# Patient Record
Sex: Female | Born: 1950 | Race: White | Hispanic: No | Smoking: Never smoker
Health system: Southern US, Community
[De-identification: ages and names within clinical notes are randomized; demographics above are authoritative.]

## PROBLEM LIST (undated history)

## (undated) DIAGNOSIS — H409 Unspecified glaucoma: Secondary | ICD-10-CM

## (undated) DIAGNOSIS — M359 Systemic involvement of connective tissue, unspecified: Secondary | ICD-10-CM

## (undated) DIAGNOSIS — G43909 Migraine, unspecified, not intractable, without status migrainosus: Secondary | ICD-10-CM

## (undated) DIAGNOSIS — K219 Gastro-esophageal reflux disease without esophagitis: Secondary | ICD-10-CM

## (undated) DIAGNOSIS — M1990 Unspecified osteoarthritis, unspecified site: Secondary | ICD-10-CM

## (undated) HISTORY — PX: CARPAL TUNNEL RELEASE: SHX101

## (undated) HISTORY — DX: Unspecified glaucoma: H40.9

## (undated) HISTORY — PX: JOINT REPLACEMENT: SHX530

## (undated) HISTORY — DX: Migraine, unspecified, not intractable, without status migrainosus: G43.909

## (undated) HISTORY — PX: CATARACT EXTRACTION: SUR2

## (undated) HISTORY — PX: BACK SURGERY: SHX140

## (undated) HISTORY — DX: Gastro-esophageal reflux disease without esophagitis: K21.9

## (undated) HISTORY — DX: Systemic involvement of connective tissue, unspecified: M35.9

## (undated) HISTORY — PX: CERVICAL SPINE SURGERY: SHX589

## (undated) HISTORY — DX: Unspecified osteoarthritis, unspecified site: M19.90

---

## 2016-04-30 DIAGNOSIS — M545 Low back pain: Secondary | ICD-10-CM | POA: Diagnosis not present

## 2016-05-01 DIAGNOSIS — M545 Low back pain: Secondary | ICD-10-CM | POA: Diagnosis not present

## 2016-05-04 DIAGNOSIS — M545 Low back pain: Secondary | ICD-10-CM | POA: Diagnosis not present

## 2016-05-08 DIAGNOSIS — M545 Low back pain: Secondary | ICD-10-CM | POA: Diagnosis not present

## 2016-05-12 DIAGNOSIS — M545 Low back pain: Secondary | ICD-10-CM | POA: Diagnosis not present

## 2016-05-20 DIAGNOSIS — M545 Low back pain: Secondary | ICD-10-CM | POA: Diagnosis not present

## 2016-05-22 DIAGNOSIS — M545 Low back pain: Secondary | ICD-10-CM | POA: Diagnosis not present

## 2016-05-25 DIAGNOSIS — M545 Low back pain: Secondary | ICD-10-CM | POA: Diagnosis not present

## 2016-05-26 DIAGNOSIS — M25552 Pain in left hip: Secondary | ICD-10-CM | POA: Diagnosis not present

## 2016-05-26 DIAGNOSIS — M533 Sacrococcygeal disorders, not elsewhere classified: Secondary | ICD-10-CM | POA: Diagnosis not present

## 2016-06-30 DIAGNOSIS — H401131 Primary open-angle glaucoma, bilateral, mild stage: Secondary | ICD-10-CM | POA: Diagnosis not present

## 2016-10-20 DIAGNOSIS — Z9189 Other specified personal risk factors, not elsewhere classified: Secondary | ICD-10-CM | POA: Diagnosis not present

## 2016-10-20 DIAGNOSIS — Z1212 Encounter for screening for malignant neoplasm of rectum: Secondary | ICD-10-CM | POA: Diagnosis not present

## 2016-10-20 DIAGNOSIS — Z87410 Personal history of cervical dysplasia: Secondary | ICD-10-CM | POA: Diagnosis not present

## 2016-10-20 DIAGNOSIS — Z9289 Personal history of other medical treatment: Secondary | ICD-10-CM | POA: Diagnosis not present

## 2016-10-20 DIAGNOSIS — N952 Postmenopausal atrophic vaginitis: Secondary | ICD-10-CM | POA: Diagnosis not present

## 2016-10-20 DIAGNOSIS — Z1231 Encounter for screening mammogram for malignant neoplasm of breast: Secondary | ICD-10-CM | POA: Diagnosis not present

## 2017-01-04 DIAGNOSIS — H401131 Primary open-angle glaucoma, bilateral, mild stage: Secondary | ICD-10-CM | POA: Diagnosis not present

## 2017-07-03 DIAGNOSIS — R6889 Other general symptoms and signs: Secondary | ICD-10-CM | POA: Diagnosis not present

## 2017-07-03 DIAGNOSIS — J0110 Acute frontal sinusitis, unspecified: Secondary | ICD-10-CM | POA: Diagnosis not present

## 2017-07-03 DIAGNOSIS — J9801 Acute bronchospasm: Secondary | ICD-10-CM | POA: Diagnosis not present

## 2017-08-02 DIAGNOSIS — H401131 Primary open-angle glaucoma, bilateral, mild stage: Secondary | ICD-10-CM | POA: Diagnosis not present

## 2018-01-14 ENCOUNTER — Telehealth: Payer: Self-pay | Admitting: General Practice

## 2018-01-14 NOTE — Telephone Encounter (Signed)
Copied from CRM [id number]. Topic: Inquiry >> Jan 14, 2018 12:23 PM Lorrine KinMcGee, Demi B, NT wrote:  Reason for CRM: Calling to see if Dr Para Marchuncan is willing to accept her has a new patient. States that she was referred by current patient's of Dr Para Marchuncan. States that her husband, her son, and his wife and 2 kids see Dr Para Marchuncan. Patient is covered under medicare and [employer]. Please advise.

## 2018-01-16 NOTE — Telephone Encounter (Signed)
30 min OV when possible.  Thanks . 

## 2018-01-18 NOTE — Telephone Encounter (Signed)
I spoke with pt and she was very thankful. Appt set up 03/10/18.

## 2018-01-26 DIAGNOSIS — M1711 Unilateral primary osteoarthritis, right knee: Secondary | ICD-10-CM | POA: Diagnosis not present

## 2018-02-07 DIAGNOSIS — H401131 Primary open-angle glaucoma, bilateral, mild stage: Secondary | ICD-10-CM | POA: Diagnosis not present

## 2018-02-07 DIAGNOSIS — H1851 Endothelial corneal dystrophy: Secondary | ICD-10-CM | POA: Diagnosis not present

## 2018-02-21 DIAGNOSIS — Z1231 Encounter for screening mammogram for malignant neoplasm of breast: Secondary | ICD-10-CM | POA: Diagnosis not present

## 2018-02-21 DIAGNOSIS — Z1212 Encounter for screening for malignant neoplasm of rectum: Secondary | ICD-10-CM | POA: Diagnosis not present

## 2018-02-21 DIAGNOSIS — Z9289 Personal history of other medical treatment: Secondary | ICD-10-CM | POA: Diagnosis not present

## 2018-02-21 LAB — HM PAP SMEAR: HM Pap smear: NORMAL

## 2018-02-21 LAB — HM MAMMOGRAPHY

## 2018-02-21 LAB — FECAL OCCULT BLOOD, GUAIAC: FECAL OCCULT BLD: NEGATIVE

## 2018-02-23 DIAGNOSIS — M1711 Unilateral primary osteoarthritis, right knee: Secondary | ICD-10-CM | POA: Diagnosis not present

## 2018-03-10 ENCOUNTER — Encounter: Payer: Self-pay | Admitting: Family Medicine

## 2018-03-10 ENCOUNTER — Ambulatory Visit (INDEPENDENT_AMBULATORY_CARE_PROVIDER_SITE_OTHER): Payer: Medicare Other | Admitting: Family Medicine

## 2018-03-10 VITALS — BP 140/90 | HR 64 | Temp 98.6°F | Ht 61.5 in | Wt 201.0 lb

## 2018-03-10 DIAGNOSIS — M2550 Pain in unspecified joint: Secondary | ICD-10-CM

## 2018-03-10 DIAGNOSIS — Z638 Other specified problems related to primary support group: Secondary | ICD-10-CM | POA: Diagnosis not present

## 2018-03-10 DIAGNOSIS — M1990 Unspecified osteoarthritis, unspecified site: Secondary | ICD-10-CM

## 2018-03-10 DIAGNOSIS — Z0000 Encounter for general adult medical examination without abnormal findings: Secondary | ICD-10-CM

## 2018-03-10 DIAGNOSIS — Z7189 Other specified counseling: Secondary | ICD-10-CM

## 2018-03-10 DIAGNOSIS — M25519 Pain in unspecified shoulder: Secondary | ICD-10-CM | POA: Diagnosis not present

## 2018-03-10 DIAGNOSIS — M25569 Pain in unspecified knee: Secondary | ICD-10-CM

## 2018-03-10 LAB — BASIC METABOLIC PANEL
BUN: 25 mg/dL — AB (ref 6–23)
CO2: 30 mEq/L (ref 19–32)
Calcium: 9.5 mg/dL (ref 8.4–10.5)
Chloride: 102 mEq/L (ref 96–112)
Creatinine, Ser: 0.68 mg/dL (ref 0.40–1.20)
GFR: 91.55 mL/min (ref >60.00)
GLUCOSE: 86 mg/dL (ref 70–99)
Potassium: 4.5 mEq/L (ref 3.5–5.1)
Sodium: 138 mEq/L (ref 135–145)

## 2018-03-10 LAB — CBC WITH DIFFERENTIAL/PLATELET
BASOS ABS: 0.1 10*3/uL (ref 0.0–0.1)
Basophils Relative: 1.2 % (ref 0.0–3.0)
Eosinophils Absolute: 0.2 10*3/uL (ref 0.0–0.7)
Eosinophils Relative: 4 % (ref 0.0–5.0)
HEMATOCRIT: 40.5 % (ref 36.0–46.0)
HEMOGLOBIN: 13.3 g/dL (ref 12.0–15.0)
Lymphocytes Relative: 30.3 % (ref 12.0–46.0)
Lymphs Abs: 1.4 10*3/uL (ref 0.7–4.0)
MCHC: 32.8 g/dL (ref 30.0–36.0)
MCV: 90.7 fl (ref 78.0–100.0)
Monocytes Absolute: 0.7 10*3/uL (ref 0.1–1.0)
Monocytes Relative: 14.9 % — ABNORMAL HIGH (ref 3.0–12.0)
Neutro Abs: 2.4 10*3/uL (ref 1.4–7.7)
Neutrophils Relative %: 49.6 % (ref 43.0–77.0)
PLATELETS: 240 10*3/uL (ref 150.0–400.0)
RBC: 4.46 Mil/uL (ref 3.87–5.11)
RDW: 13.8 % (ref 11.5–15.5)
WBC: 4.8 10*3/uL (ref 4.0–10.5)

## 2018-03-10 NOTE — Progress Notes (Signed)
New patient.    Requesting old records.  Establishing care today.  Husband with significant memory loss.  This is a significant stressor for the patient.  She now has home health that is helpful.  She lives with her husband at home about half of the week.  About half of the week the 2 of them pack up and go live with her son and his family.  Extended family is supportive.  Knee arthritis.  Recently seen by orthopedics.  Was told that she had significant arthritis and would be a candidate for knee replacements.  She is trying injections and other options in the meantime.  She will to put off any other intervention given her husband's dementia.  She has a history of an unspecified connective tissue disorder that was previously followed by [facility].  Will review old records.  She was previously on Plaquenil but it did not change her symptoms.  No worsening of symptoms off medication.  She has glaucoma and is followed at the eye clinic.  Wynelle LinkSun designated if patient were incapacitated.  I told her that I would look through her old records and see about health maintenance as well along.  Flu shot encouraged.  She declined at this point.  Right shoulder pain.  No acute injury.  No arm drop.  Pain with range of motion.  No fevers chills nausea vomiting or other acute issues noted.  Discussed.  PMH and SH reviewed  ROS: Per HPI unless specifically indicated in ROS section   Meds, vitals, and allergies reviewed.   GEN: nad, alert and oriented, tearful when discussing her husband but able to regain composure. HEENT: mucous membranes moist NECK: supple w/o LA CV: rrr PULM: ctab, no inc wob ABD: soft, +bs EXT: no edema SKIN: no acute rash Knee pain with crepitus on range of motion. Right arm with normal range of motion on pendulum swing.  No arm drop but R suprascapular area ttp.  She has pain on supraspinatus testing.  External rotation is improved with less pain with scapular  assist.

## 2018-03-10 NOTE — Patient Instructions (Addendum)
Let me get your old records and see about options.   Go to the lab on the way out.  We'll contact you with your lab report. Take aleve with food.  Update me as needed.  Take care.  Glad to see you.   Try the shoulder exercises in the meantime.

## 2018-03-13 ENCOUNTER — Encounter: Payer: Self-pay | Admitting: Family Medicine

## 2018-03-13 DIAGNOSIS — M25569 Pain in unspecified knee: Secondary | ICD-10-CM | POA: Insufficient documentation

## 2018-03-13 DIAGNOSIS — Z7189 Other specified counseling: Secondary | ICD-10-CM | POA: Insufficient documentation

## 2018-03-13 DIAGNOSIS — M25519 Pain in unspecified shoulder: Secondary | ICD-10-CM | POA: Insufficient documentation

## 2018-03-13 DIAGNOSIS — Z638 Other specified problems related to primary support group: Secondary | ICD-10-CM | POA: Insufficient documentation

## 2018-03-13 DIAGNOSIS — M1990 Unspecified osteoarthritis, unspecified site: Secondary | ICD-10-CM | POA: Insufficient documentation

## 2018-03-13 DIAGNOSIS — Z0000 Encounter for general adult medical examination without abnormal findings: Secondary | ICD-10-CM | POA: Insufficient documentation

## 2018-03-13 NOTE — Assessment & Plan Note (Addendum)
She does have some improvement of range of motion and pain with scapular assist.  Discussed rotator cuff anatomy.  Home exercise program handout given and demonstrated to patient.  She can try that in the meantime and see if she has any help.  She will update me as needed.  In terms of arthritis in general I will look back at her old records from the [facility].  Discussed NSAID caution.  See after visit summary.  Reasonable to check basic labs given NSAID use.  >30 minutes spent in face to face time with patient, >50% spent in counselling or coordination of care

## 2018-03-13 NOTE — Assessment & Plan Note (Signed)
She is trying to manage the situation as best she can.  Support offered.  She has home health and that has been helpful.  Extended family is helpful.  She will update me as needed.

## 2018-03-13 NOTE — Assessment & Plan Note (Addendum)
Son Jason designated if patient were incapacitated. 

## 2018-03-13 NOTE — Assessment & Plan Note (Signed)
Per report, status post injection.  She is trying to put off surgery as long as she can.  She is worried about having surgery given her husband's ongoing care needs.  This is reasonable.  I will review orthopedic notes.

## 2018-03-13 NOTE — Assessment & Plan Note (Signed)
Flu shot encouraged.  We will request old records.

## 2018-03-27 ENCOUNTER — Telehealth: Payer: Self-pay | Admitting: Family Medicine

## 2018-03-27 NOTE — Telephone Encounter (Signed)
See outside records re: mammogram, pap, IFOB.   [facility], please enter in EMR.  Please call pt.  I looked back at records from ortho/[facility].   I saw the inpatient notes about her husband.  Offer routine vaccination to patient, esp flu/PNA, esp given her husband's situation.   Update me as needed, let me know if I can be of service (esp given her husband's situation). Thanks.

## 2018-03-28 ENCOUNTER — Encounter: Payer: Self-pay | Admitting: Family Medicine

## 2018-03-28 LAB — LAB REPORT - SCANNED
HEP C AB: NEGATIVE
Hepatitis C Ab: NEGATIVE

## 2018-03-28 NOTE — Telephone Encounter (Signed)
Left detailed message on voicemail. DPR 

## 2018-08-30 DIAGNOSIS — H401134 Primary open-angle glaucoma, bilateral, indeterminate stage: Secondary | ICD-10-CM | POA: Diagnosis not present

## 2018-10-18 DIAGNOSIS — M1711 Unilateral primary osteoarthritis, right knee: Secondary | ICD-10-CM | POA: Diagnosis not present

## 2018-10-24 ENCOUNTER — Ambulatory Visit: Payer: Self-pay | Admitting: Orthopedic Surgery

## 2018-11-07 DIAGNOSIS — M25461 Effusion, right knee: Secondary | ICD-10-CM | POA: Diagnosis not present

## 2018-11-07 DIAGNOSIS — M7121 Synovial cyst of popliteal space [Baker], right knee: Secondary | ICD-10-CM | POA: Diagnosis not present

## 2018-11-07 DIAGNOSIS — M94261 Chondromalacia, right knee: Secondary | ICD-10-CM | POA: Diagnosis not present

## 2018-11-07 DIAGNOSIS — M1711 Unilateral primary osteoarthritis, right knee: Secondary | ICD-10-CM | POA: Diagnosis not present

## 2018-11-07 DIAGNOSIS — S83231A Complex tear of medial meniscus, current injury, right knee, initial encounter: Secondary | ICD-10-CM | POA: Diagnosis not present

## 2018-11-14 ENCOUNTER — Encounter: Payer: Self-pay | Admitting: Family Medicine

## 2018-11-14 ENCOUNTER — Other Ambulatory Visit: Payer: Self-pay

## 2018-11-14 ENCOUNTER — Ambulatory Visit (INDEPENDENT_AMBULATORY_CARE_PROVIDER_SITE_OTHER): Payer: Medicare Other | Admitting: Family Medicine

## 2018-11-14 VITALS — BP 142/76 | HR 72 | Temp 98.0°F | Ht 61.5 in | Wt 181.2 lb

## 2018-11-14 DIAGNOSIS — M25569 Pain in unspecified knee: Secondary | ICD-10-CM | POA: Diagnosis not present

## 2018-11-14 DIAGNOSIS — Z029 Encounter for administrative examinations, unspecified: Secondary | ICD-10-CM

## 2018-11-14 DIAGNOSIS — Z01818 Encounter for other preprocedural examination: Secondary | ICD-10-CM | POA: Diagnosis not present

## 2018-11-14 NOTE — Progress Notes (Signed)
Plan for R TKA.  D/w pt.  Sig pain.  She doesn't want to need surgery but hopes for improvement with the operation.  No CP, SOB. Trace BLE edema at baseline.  We talked about risk and benefit of the surgery.  She has trouble walking from knee pain, not from CV/pulmonary issues.  No h/o CV disease.    We talked about the death of her husband and pandemic considerations.   She has been home schooling her grandkids over the summer.   Weight normalized after cessation of stress eating.  This isn't unintentional weight loss.    Meds, vitals, and allergies reviewed.   ROS: Per HPI unless specifically indicated in ROS section   GEN: nad, alert and oriented HEENT: ncat NECK: supple w/o LA CV: rrr PULM: ctab, no inc wob ABD: soft, +bs EXT: no edema SKIN: no acute rash R knee with audible crepitus.    EKG reviewed.  See notes on report.

## 2018-11-14 NOTE — Patient Instructions (Addendum)
Assuming your labs and EKG are unremarkable, then it would appear that you are appropriately low risk for surgery.  I'll update ortho when I get your results.   Take care.  Glad to see you.

## 2018-11-16 NOTE — Assessment & Plan Note (Signed)
Her EKG is normal and she does not have any known cardiovascular disease.  She is not having chest pain or shortness of breath.  Her only limitation for exertion is knee pain, not a cardiopulmonary issue.  It would appear that she is appropriately low risk for surgery.  I told her that I cannot "clear" her for surgery but she does appear to be appropriately low risk.  I will defer her labs given that she already has orders for that in the EMR and it likely makes sense to get those labs closer to the time of her operation.  I appreciate the help of all involved, especially orthopedics and anesthesia.  >25 minutes spent in face to face time with patient, >50% spent in counselling or coordination of care.

## 2018-12-01 ENCOUNTER — Telehealth: Payer: Self-pay

## 2018-12-01 NOTE — Telephone Encounter (Signed)
Noted. Thanks.

## 2018-12-01 NOTE — Telephone Encounter (Signed)
Copied from [place] [id number]. Topic: Referral - Status >> Dec 01, 2018  6:06 PM Simone Curia D wrote: [birth year] Spoke with patient about [facility] in [place]. Gave patient information for facilities that accept Medicare: [facility], [facility] and [facility]. Ambrose Mantle [phone]

## 2018-12-28 DIAGNOSIS — M25561 Pain in right knee: Secondary | ICD-10-CM | POA: Diagnosis not present

## 2018-12-28 DIAGNOSIS — M179 Osteoarthritis of knee, unspecified: Secondary | ICD-10-CM | POA: Diagnosis not present

## 2019-01-04 ENCOUNTER — Other Ambulatory Visit
Admission: RE | Admit: 2019-01-04 | Discharge: 2019-01-04 | Disposition: A | Discharge status: Home / Self Care | Payer: Medicare Other | Source: Ambulatory Visit | Attending: Orthopedic Surgery | Admitting: Orthopedic Surgery

## 2019-01-04 ENCOUNTER — Other Ambulatory Visit: Payer: Self-pay

## 2019-01-04 DIAGNOSIS — Z01812 Encounter for preprocedural laboratory examination: Secondary | ICD-10-CM | POA: Insufficient documentation

## 2019-01-04 DIAGNOSIS — M1711 Unilateral primary osteoarthritis, right knee: Secondary | ICD-10-CM | POA: Insufficient documentation

## 2019-01-04 DIAGNOSIS — Z79899 Other long term (current) drug therapy: Secondary | ICD-10-CM | POA: Insufficient documentation

## 2019-01-04 LAB — CBC
HCT: 38.8 % (ref 36.0–46.0)
Hemoglobin: 12.4 g/dL (ref 12.0–15.0)
MCH: 30.2 pg (ref 26.0–34.0)
MCHC: 32 g/dL (ref 30.0–36.0)
MCV: 94.4 fL (ref 80.0–100.0)
Platelets: 239 10*3/uL (ref 150–400)
RBC: 4.11 MIL/uL (ref 3.87–5.11)
RDW: 13.5 % (ref 11.5–15.5)
WBC: 3.9 10*3/uL — ABNORMAL LOW (ref 4.0–10.5)
nRBC: 0 % (ref 0.0–0.2)

## 2019-01-04 LAB — BASIC METABOLIC PANEL
Anion gap: 9 (ref 5–15)
BUN: 23 mg/dL (ref 8–23)
CO2: 26 mmol/L (ref 22–32)
Calcium: 9.3 mg/dL (ref 8.9–10.3)
Chloride: 105 mmol/L (ref 98–111)
Creatinine, Ser: 0.62 mg/dL (ref 0.44–1.00)
GFR calc Af Amer: >60 mL/min (ref >60)
GFR calc non Af Amer: >60 mL/min (ref >60)
Glucose, Bld: 86 mg/dL (ref 70–99)
Potassium: 3.6 mmol/L (ref 3.5–5.1)
Sodium: 140 mmol/L (ref 135–145)

## 2019-01-04 LAB — URINALYSIS, ROUTINE W REFLEX MICROSCOPIC
Bilirubin Urine: NEGATIVE
Glucose, UA: NEGATIVE mg/dL
Hgb urine dipstick: NEGATIVE
Ketones, ur: NEGATIVE mg/dL
Leukocytes,Ua: NEGATIVE
Nitrite: NEGATIVE
Protein, ur: NEGATIVE mg/dL
Specific Gravity, Urine: 1.024 (ref 1.005–1.030)
pH: 5 (ref 5.0–8.0)

## 2019-01-04 LAB — SURGICAL PCR SCREEN
MRSA, PCR: NEGATIVE
Staphylococcus aureus: NEGATIVE

## 2019-01-04 LAB — TYPE AND SCREEN
ABO/RH(D): AB POS
Antibody Screen: NEGATIVE

## 2019-01-04 LAB — PROTIME-INR
INR: 1 (ref 0.8–1.2)
Prothrombin Time: 12.8 seconds (ref 11.4–15.2)

## 2019-01-04 LAB — APTT: aPTT: 31 seconds (ref 24–36)

## 2019-01-04 NOTE — Pre-Procedure Instructions (Signed)
Incentive spirometry and carbohydrate drink given along with instructions. 

## 2019-01-04 NOTE — Patient Instructions (Addendum)
Your procedure is scheduled on: 01/11/2019 Wed Report to Same Day Surgery 2nd floor medical mall [facility](Medical Mall Entrance-take elevator on left to 2nd floor.  Check in with surgery information desk.) To find out your arrival time please call [phone] between 1PM - 3PM on 01/10/2019 Tues  Remember: Instructions that are not followed completely may result in serious medical risk, up to and including death, or upon the discretion of your surgeon and anesthesiologist your surgery may need to be rescheduled.    _x___ 1. Do not eat food after midnight the night before your procedure. You may drink clear liquids up to 2 hours before you are scheduled to arrive at the hospital for your procedure.  Do not drink clear liquids within 2 hours of your scheduled arrival to the hospital.  Clear liquids include  --Water or Apple juice without pulp  --Clear carbohydrate beverage such as ClearFast or Gatorade  --Black Coffee or Clear Tea (No milk, no creamers, do not add anything to                  the coffee or Tea Type 1 and type 2 diabetics should only drink water.   ____Ensure clear carbohydrate drink on the way to the hospital for bariatric patients  _x___Ensure clear carbohydrate drink 3 hours before surgery.   No gum chewing or hard candies.     __x__ 2. No Alcohol for 24 hours before or after surgery.   __x__3. No Smoking or e-cigarettes for 24 prior to surgery.  Do not use any chewable tobacco products for at least 6 hour prior to surgery   ____  4. Bring all medications with you on the day of surgery if instructed.    __x__ 5. Notify your doctor if there is any change in your medical condition     (cold, fever, infections).    x___6. On the morning of surgery brush your teeth with toothpaste and water.  You may rinse your mouth with mouth wash if you wish.  Do not swallow any toothpaste or mouthwash.   Do not wear jewelry, make-up, hairpins, clips or nail polish.  Do not wear lotions,  powders, or perfumes. You may wear deodorant.  Do not shave 48 hours prior to surgery. Men may shave face and neck.  Do not bring valuables to the hospital.    [facility] is not responsible for any belongings or valuables.               Contacts, dentures or bridgework may not be worn into surgery.  Leave your suitcase in the car. After surgery it may be brought to your room.  For patients admitted to the hospital, discharge time is determined by your                       treatment team.  _  Patients discharged the day of surgery will not be allowed to drive home.  You will need someone to drive you home and stay with you the night of your procedure.    Please read over the following fact sheets that you were given:   [facility] Preparing for Surgery and or MRSA Information   _x___ Take anti-hypertensive listed below, cardiac, seizure, asthma,     anti-reflux and psychiatric medicines. These include:  1. famotidine (PEPCID) 10 MG tablet take a dose the night before and the morning of surgery.  2.  3.  4.  5.  6.  ____Fleets enema or Magnesium Citrate as directed.   _x___ Use CHG Soap or sage wipes as directed on instruction sheet   ____ Use inhalers on the day of surgery and bring to hospital day of surgery  ____ Stop Metformin and Janumet 2 days prior to surgery.    ____ Take 1/2 of usual insulin dose the night before surgery and none on the morning     surgery.   _x___ Follow recommendations from Cardiologist, Pulmonologist or PCP regarding          stopping Aspirin, Coumadin, Plavix ,Eliquis, Effient, or Pradaxa, and Pletal.  X____Stop Anti-inflammatories such as Advil, Aleve, Ibuprofen, Motrin, Naproxen, Naprosyn, Goodies powders or aspirin products. OK to take Tylenol and                          Celebrex.   _x___ Stop supplements until after surgery.  But may continue Vitamin D, Vitamin B,       and multivitamin.  Stop vitamin E and turmeric today.   ____ Bring C-Pap  to the hospital.

## 2019-01-06 ENCOUNTER — Other Ambulatory Visit
Admission: RE | Admit: 2019-01-06 | Discharge: 2019-01-06 | Disposition: A | Discharge status: Home / Self Care | Payer: Medicare Other | Source: Ambulatory Visit | Attending: Orthopedic Surgery | Admitting: Orthopedic Surgery

## 2019-01-06 ENCOUNTER — Other Ambulatory Visit: Payer: Self-pay

## 2019-01-06 DIAGNOSIS — Z01812 Encounter for preprocedural laboratory examination: Secondary | ICD-10-CM | POA: Insufficient documentation

## 2019-01-06 DIAGNOSIS — Z20828 Contact with and (suspected) exposure to other viral communicable diseases: Secondary | ICD-10-CM | POA: Insufficient documentation

## 2019-01-07 LAB — SARS CORONAVIRUS 2 (TAT 6-24 HRS): SARS Coronavirus 2: NEGATIVE

## 2019-01-27 ENCOUNTER — Other Ambulatory Visit
Admission: RE | Admit: 2019-01-27 | Discharge: 2019-01-27 | Disposition: A | Discharge status: Home / Self Care | Payer: Medicare Other | Source: Ambulatory Visit | Attending: Orthopedic Surgery | Admitting: Orthopedic Surgery

## 2019-01-27 DIAGNOSIS — Z01812 Encounter for preprocedural laboratory examination: Secondary | ICD-10-CM | POA: Diagnosis not present

## 2019-01-27 DIAGNOSIS — Z20828 Contact with and (suspected) exposure to other viral communicable diseases: Secondary | ICD-10-CM | POA: Diagnosis not present

## 2019-01-27 LAB — SARS CORONAVIRUS 2 (TAT 6-24 HRS): SARS Coronavirus 2: NEGATIVE

## 2019-01-31 MED ORDER — TRANEXAMIC ACID-NACL 1000-0.7 MG/100ML-% IV SOLN
1000.0000 mg | INTRAVENOUS | Status: DC
  Filled 2019-01-31: qty 100

## 2019-01-31 MED ORDER — CEFAZOLIN SODIUM-DEXTROSE 2-4 GM/100ML-% IV SOLN
2.0000 g | INTRAVENOUS | Status: AC
  Administered 2019-02-01: 2 g via INTRAVENOUS

## 2019-02-01 ENCOUNTER — Inpatient Hospital Stay: Payer: Medicare Other

## 2019-02-01 ENCOUNTER — Other Ambulatory Visit: Payer: Self-pay

## 2019-02-01 ENCOUNTER — Inpatient Hospital Stay: Payer: Medicare Other | Admitting: Anesthesiology

## 2019-02-01 ENCOUNTER — Encounter
Admission: RE | Disposition: A | Discharge status: Home Health | Payer: Self-pay | Source: Ambulatory Visit | Attending: Orthopedic Surgery

## 2019-02-01 ENCOUNTER — Inpatient Hospital Stay
Admission: RE | Admit: 2019-02-01 | Discharge: 2019-02-04 | DRG: 470 | Disposition: A | Discharge status: Home Health | Payer: Medicare Other | Source: Ambulatory Visit | Attending: Orthopedic Surgery | Admitting: Orthopedic Surgery

## 2019-02-01 ENCOUNTER — Encounter: Payer: Self-pay | Admitting: *Deleted

## 2019-02-01 DIAGNOSIS — K219 Gastro-esophageal reflux disease without esophagitis: Secondary | ICD-10-CM | POA: Diagnosis present

## 2019-02-01 DIAGNOSIS — M1711 Unilateral primary osteoarthritis, right knee: Secondary | ICD-10-CM | POA: Diagnosis present

## 2019-02-01 DIAGNOSIS — G8918 Other acute postprocedural pain: Secondary | ICD-10-CM | POA: Diagnosis not present

## 2019-02-01 DIAGNOSIS — Z96651 Presence of right artificial knee joint: Secondary | ICD-10-CM

## 2019-02-01 DIAGNOSIS — H409 Unspecified glaucoma: Secondary | ICD-10-CM | POA: Diagnosis present

## 2019-02-01 DIAGNOSIS — Z471 Aftercare following joint replacement surgery: Secondary | ICD-10-CM | POA: Diagnosis not present

## 2019-02-01 DIAGNOSIS — G43909 Migraine, unspecified, not intractable, without status migrainosus: Secondary | ICD-10-CM | POA: Diagnosis present

## 2019-02-01 DIAGNOSIS — Z885 Allergy status to narcotic agent status: Secondary | ICD-10-CM | POA: Diagnosis not present

## 2019-02-01 DIAGNOSIS — Z09 Encounter for follow-up examination after completed treatment for conditions other than malignant neoplasm: Secondary | ICD-10-CM

## 2019-02-01 DIAGNOSIS — M25561 Pain in right knee: Secondary | ICD-10-CM | POA: Diagnosis not present

## 2019-02-01 HISTORY — PX: TOTAL KNEE ARTHROPLASTY: SHX125

## 2019-02-01 LAB — TYPE AND SCREEN
ABO/RH(D): AB POS
Antibody Screen: NEGATIVE

## 2019-02-01 SURGERY — ARTHROPLASTY, KNEE, TOTAL
Anesthesia: Spinal | Site: Knee | Laterality: Right

## 2019-02-01 MED ORDER — TIMOLOL MALEATE 0.5 % OP SOLN
1.0000 [drp] | Freq: Two times a day (BID) | OPHTHALMIC | Status: DC
  Administered 2019-02-01 – 2019-02-04 (×6): 1 [drp] via OPHTHALMIC
  Filled 2019-02-01: qty 5

## 2019-02-01 MED ORDER — ONDANSETRON HCL 4 MG/2ML IJ SOLN: 4.0000 mg | Freq: Four times a day (QID) | INTRAMUSCULAR | Status: DC | PRN

## 2019-02-01 MED ORDER — SODIUM CHLORIDE 0.9 % IV SOLN
INTRAVENOUS | Status: DC | PRN
  Administered 2019-02-01: 16:00:00 60 mL

## 2019-02-01 MED ORDER — ALUM & MAG HYDROXIDE-SIMETH 200-200-20 MG/5ML PO SUSP: 30.0000 mL | ORAL | Status: DC | PRN

## 2019-02-01 MED ORDER — MENTHOL 3 MG MT LOZG
1.0000 | LOZENGE | OROMUCOSAL | Status: DC | PRN
  Filled 2019-02-01: qty 9

## 2019-02-01 MED ORDER — MIDAZOLAM HCL 2 MG/2ML IJ SOLN
1.0000 mg | Freq: Once | INTRAMUSCULAR | Status: AC
  Administered 2019-02-01: 1 mg via INTRAVENOUS

## 2019-02-01 MED ORDER — LIDOCAINE HCL (PF) 1 % IJ SOLN
INTRAMUSCULAR | Status: AC
  Filled 2019-02-01: qty 5

## 2019-02-01 MED ORDER — FENTANYL CITRATE (PF) 100 MCG/2ML IJ SOLN: 50.0000 ug | Freq: Once | INTRAMUSCULAR | Status: DC

## 2019-02-01 MED ORDER — BUPIVACAINE HCL (PF) 0.5 % IJ SOLN
INTRAMUSCULAR | Status: AC
  Filled 2019-02-01: qty 10

## 2019-02-01 MED ORDER — BUPIVACAINE LIPOSOME 1.3 % IJ SUSP
INTRAMUSCULAR | Status: AC
  Filled 2019-02-01: qty 20

## 2019-02-01 MED ORDER — MAGNESIUM CITRATE PO SOLN
1.0000 | Freq: Once | ORAL | Status: DC | PRN
  Filled 2019-02-01: qty 296

## 2019-02-01 MED ORDER — SODIUM CHLORIDE FLUSH 0.9 % IV SOLN
INTRAVENOUS | Status: AC
  Filled 2019-02-01: qty 20

## 2019-02-01 MED ORDER — MORPHINE SULFATE (PF) 2 MG/ML IV SOLN: 0.5000 mg | INTRAVENOUS | Status: DC | PRN

## 2019-02-01 MED ORDER — METHOCARBAMOL 500 MG PO TABS: 500.0000 mg | ORAL_TABLET | Freq: Four times a day (QID) | ORAL | Status: DC | PRN

## 2019-02-01 MED ORDER — ONDANSETRON HCL 4 MG/2ML IJ SOLN: 4.0000 mg | Freq: Once | INTRAMUSCULAR | Status: DC | PRN

## 2019-02-01 MED ORDER — ACETAMINOPHEN 500 MG PO TABS
1000.0000 mg | ORAL_TABLET | Freq: Once | ORAL | Status: AC
  Administered 2019-02-01: 1000 mg via ORAL

## 2019-02-01 MED ORDER — ROPIVACAINE HCL 5 MG/ML IJ SOLN
INTRAMUSCULAR | Status: AC
  Filled 2019-02-01: qty 30

## 2019-02-01 MED ORDER — DEXAMETHASONE SODIUM PHOSPHATE 10 MG/ML IJ SOLN
INTRAMUSCULAR | Status: DC | PRN
  Administered 2019-02-01: 10 mg via INTRAVENOUS

## 2019-02-01 MED ORDER — CEFAZOLIN SODIUM-DEXTROSE 2-4 GM/100ML-% IV SOLN
INTRAVENOUS | Status: AC
  Filled 2019-02-01: qty 100

## 2019-02-01 MED ORDER — BUPIVACAINE-EPINEPHRINE (PF) 0.5% -1:200000 IJ SOLN
INTRAMUSCULAR | Status: DC | PRN
  Administered 2019-02-01: 30 mL via PERINEURAL

## 2019-02-01 MED ORDER — ROPIVACAINE HCL 5 MG/ML IJ SOLN
INTRAMUSCULAR | Status: DC | PRN
  Administered 2019-02-01: 30 mL via PERINEURAL

## 2019-02-01 MED ORDER — ACETAMINOPHEN 500 MG PO TABS
500.0000 mg | ORAL_TABLET | Freq: Four times a day (QID) | ORAL | Status: AC
  Administered 2019-02-01 – 2019-02-02 (×4): 500 mg via ORAL
  Filled 2019-02-01 (×4): qty 1

## 2019-02-01 MED ORDER — LACTATED RINGERS IV SOLN: INTRAVENOUS | Status: DC

## 2019-02-01 MED ORDER — SODIUM CHLORIDE 0.9 % IR SOLN
Status: DC | PRN
  Administered 2019-02-01: 2000 mL

## 2019-02-01 MED ORDER — CELECOXIB 200 MG PO CAPS
ORAL_CAPSULE | ORAL | Status: AC
  Filled 2019-02-01: qty 2

## 2019-02-01 MED ORDER — CELECOXIB 200 MG PO CAPS
400.0000 mg | ORAL_CAPSULE | Freq: Once | ORAL | Status: AC
  Administered 2019-02-01: 400 mg via ORAL

## 2019-02-01 MED ORDER — HYDROCODONE-ACETAMINOPHEN 7.5-325 MG PO TABS: 1.0000 | ORAL_TABLET | ORAL | Status: DC | PRN

## 2019-02-01 MED ORDER — GABAPENTIN 300 MG PO CAPS
300.0000 mg | ORAL_CAPSULE | Freq: Once | ORAL | Status: AC
  Administered 2019-02-01: 300 mg via ORAL

## 2019-02-01 MED ORDER — DIPHENHYDRAMINE HCL 12.5 MG/5ML PO ELIX: 12.5000 mg | ORAL_SOLUTION | ORAL | Status: DC | PRN

## 2019-02-01 MED ORDER — LACTATED RINGERS IV SOLN
INTRAVENOUS | Status: DC
  Administered 2019-02-01: 14:00:00 via INTRAVENOUS

## 2019-02-01 MED ORDER — METOCLOPRAMIDE HCL 10 MG PO TABS: 5.0000 mg | ORAL_TABLET | Freq: Three times a day (TID) | ORAL | Status: DC | PRN

## 2019-02-01 MED ORDER — KETOROLAC TROMETHAMINE 15 MG/ML IJ SOLN
15.0000 mg | Freq: Four times a day (QID) | INTRAMUSCULAR | Status: AC
  Administered 2019-02-01 – 2019-02-02 (×4): 15 mg via INTRAVENOUS
  Filled 2019-02-01 (×4): qty 1

## 2019-02-01 MED ORDER — LIDOCAINE HCL (PF) 1 % IJ SOLN
INTRAMUSCULAR | Status: DC | PRN
  Administered 2019-02-01: 4 mL via INTRADERMAL

## 2019-02-01 MED ORDER — MIDAZOLAM HCL 5 MG/5ML IJ SOLN
INTRAMUSCULAR | Status: DC | PRN
  Administered 2019-02-01: 2 mg via INTRAVENOUS

## 2019-02-01 MED ORDER — ONDANSETRON HCL 4 MG/2ML IJ SOLN
INTRAMUSCULAR | Status: DC | PRN
  Administered 2019-02-01: 4 mg via INTRAVENOUS

## 2019-02-01 MED ORDER — PHENOL 1.4 % MT LIQD
1.0000 | OROMUCOSAL | Status: DC | PRN
  Filled 2019-02-01: qty 177

## 2019-02-01 MED ORDER — BISACODYL 10 MG RE SUPP
10.0000 mg | Freq: Every day | RECTAL | Status: DC | PRN
  Filled 2019-02-01: qty 1

## 2019-02-01 MED ORDER — BACITRACIN 50000 UNITS IM SOLR
INTRAMUSCULAR | Status: AC
  Filled 2019-02-01: qty 2

## 2019-02-01 MED ORDER — MIDAZOLAM HCL 2 MG/2ML IJ SOLN
INTRAMUSCULAR | Status: AC
  Filled 2019-02-01: qty 2

## 2019-02-01 MED ORDER — FENTANYL CITRATE (PF) 100 MCG/2ML IJ SOLN: 25.0000 ug | INTRAMUSCULAR | Status: DC | PRN

## 2019-02-01 MED ORDER — CHLORHEXIDINE GLUCONATE 4 % EX LIQD: 60.0000 mL | Freq: Once | CUTANEOUS | Status: DC

## 2019-02-01 MED ORDER — BUPIVACAINE IN DEXTROSE 0.75-8.25 % IT SOLN
INTRATHECAL | Status: DC | PRN
  Administered 2019-02-01: 3 mL via INTRATHECAL

## 2019-02-01 MED ORDER — ONDANSETRON HCL 4 MG PO TABS: 4.0000 mg | ORAL_TABLET | Freq: Four times a day (QID) | ORAL | Status: DC | PRN

## 2019-02-01 MED ORDER — GABAPENTIN 300 MG PO CAPS
ORAL_CAPSULE | ORAL | Status: AC
  Filled 2019-02-01: qty 1

## 2019-02-01 MED ORDER — LACTATED RINGERS IV SOLN
INTRAVENOUS | Status: DC
  Administered 2019-02-01: 19:00:00 via INTRAVENOUS

## 2019-02-01 MED ORDER — PROPOFOL 500 MG/50ML IV EMUL
INTRAVENOUS | Status: DC | PRN
  Administered 2019-02-01: 70 ug/kg/min via INTRAVENOUS

## 2019-02-01 MED ORDER — ACETAMINOPHEN 325 MG PO TABS
325.0000 mg | ORAL_TABLET | Freq: Four times a day (QID) | ORAL | Status: DC | PRN
  Administered 2019-02-02 – 2019-02-04 (×4): 650 mg via ORAL
  Filled 2019-02-01 (×4): qty 2

## 2019-02-01 MED ORDER — METHOCARBAMOL 1000 MG/10ML IJ SOLN
500.0000 mg | Freq: Four times a day (QID) | INTRAVENOUS | Status: DC | PRN
  Filled 2019-02-01: qty 5

## 2019-02-01 MED ORDER — METOCLOPRAMIDE HCL 5 MG/ML IJ SOLN: 5.0000 mg | Freq: Three times a day (TID) | INTRAMUSCULAR | Status: DC | PRN

## 2019-02-01 MED ORDER — ACETAMINOPHEN 500 MG PO TABS
ORAL_TABLET | ORAL | Status: AC
  Filled 2019-02-01: qty 2

## 2019-02-01 MED ORDER — FAMOTIDINE 20 MG PO TABS: 10.0000 mg | ORAL_TABLET | Freq: Every day | ORAL | Status: DC | PRN

## 2019-02-01 MED ORDER — FENTANYL CITRATE (PF) 100 MCG/2ML IJ SOLN
INTRAMUSCULAR | Status: AC
  Filled 2019-02-01: qty 2

## 2019-02-01 MED ORDER — HYDROCODONE-ACETAMINOPHEN 5-325 MG PO TABS
1.0000 | ORAL_TABLET | ORAL | Status: DC | PRN
  Administered 2019-02-03 (×2): 1 via ORAL
  Administered 2019-02-03: 2 via ORAL
  Administered 2019-02-03: 1 via ORAL
  Filled 2019-02-01 (×3): qty 1
  Filled 2019-02-01: qty 2

## 2019-02-01 MED ORDER — BUPIVACAINE-EPINEPHRINE (PF) 0.5% -1:200000 IJ SOLN
INTRAMUSCULAR | Status: AC
  Filled 2019-02-01: qty 30

## 2019-02-01 MED ORDER — DOCUSATE SODIUM 100 MG PO CAPS
100.0000 mg | ORAL_CAPSULE | Freq: Two times a day (BID) | ORAL | Status: DC
  Administered 2019-02-01 – 2019-02-04 (×6): 100 mg via ORAL
  Filled 2019-02-01 (×6): qty 1

## 2019-02-01 MED ORDER — TRANEXAMIC ACID-NACL 1000-0.7 MG/100ML-% IV SOLN
INTRAVENOUS | Status: DC | PRN
  Administered 2019-02-01: 1000 mg via INTRAVENOUS

## 2019-02-01 MED ORDER — POVIDONE-IODINE 10 % EX SWAB: 2.0000 "application " | Freq: Once | CUTANEOUS | Status: DC

## 2019-02-01 MED ORDER — TIMOLOL HEMIHYDRATE 0.5 % OP SOLN: 1.0000 [drp] | Freq: Two times a day (BID) | OPHTHALMIC | Status: DC

## 2019-02-01 MED ORDER — FENTANYL CITRATE (PF) 100 MCG/2ML IJ SOLN
25.0000 ug | Freq: Once | INTRAMUSCULAR | Status: AC
  Administered 2019-02-01: 13:00:00 25 ug via INTRAVENOUS

## 2019-02-01 MED ORDER — RIVAROXABAN 10 MG PO TABS
10.0000 mg | ORAL_TABLET | Freq: Every day | ORAL | Status: DC
  Administered 2019-02-02 – 2019-02-04 (×3): 10 mg via ORAL
  Filled 2019-02-01 (×3): qty 1

## 2019-02-01 MED ORDER — CEFAZOLIN SODIUM-DEXTROSE 2-4 GM/100ML-% IV SOLN
2.0000 g | Freq: Four times a day (QID) | INTRAVENOUS | Status: AC
  Administered 2019-02-01 – 2019-02-02 (×2): 2 g via INTRAVENOUS
  Filled 2019-02-01 (×2): qty 100

## 2019-02-01 MED ORDER — SODIUM CHLORIDE FLUSH 0.9 % IV SOLN
INTRAVENOUS | Status: AC
  Filled 2019-02-01: qty 40

## 2019-02-01 MED ORDER — BUPIVACAINE LIPOSOME 1.3 % IJ SUSP: 10.0000 mL | Freq: Once | INTRAMUSCULAR | Status: DC

## 2019-02-01 SURGICAL SUPPLY — 61 items
BASEPLATE TIBIAL SZ 3 (Knees) ×1 IMPLANT
BLADE SAW 18WX90L 1.27 THK (BLADE) ×3 IMPLANT
BLADE SAW SAG 25X90X1.19 (BLADE) ×3 IMPLANT
BOWL CEMENT MIX W/ADAPTER (MISCELLANEOUS) ×3 IMPLANT
BRUSH SCRUB EZ  4% CHG (MISCELLANEOUS) ×4
BRUSH SCRUB EZ 4% CHG (MISCELLANEOUS) ×2 IMPLANT
CANISTER SUCT 1200ML W/VALVE (MISCELLANEOUS) ×3 IMPLANT
CANISTER SUCT 3000ML PPV (MISCELLANEOUS) ×6 IMPLANT
CEMENT BONE 1-PACK (Cement) ×6 IMPLANT
CHLORAPREP W/TINT 26 (MISCELLANEOUS) ×6 IMPLANT
COMP PATELLA GENESIS 29 OVAL (Orthopedic Implant) ×3 IMPLANT
COMPONENT FEM OXIN SZ4 RT NRRW (Orthopedic Implant) ×3 IMPLANT
COMPONENT PTLLA GENS 29 OVAL (Orthopedic Implant) ×1 IMPLANT
COOLER POLAR GLACIER W/PUMP (MISCELLANEOUS) ×3 IMPLANT
COVER WAND RF STERILE (DRAPES) ×3 IMPLANT
CUFF TOURN SGL QUICK 30 (TOURNIQUET CUFF) ×2
CUFF TRNQT CYL 30X4X21-28X (TOURNIQUET CUFF) ×1 IMPLANT
DRAPE 3/4 80X56 (DRAPES) ×6 IMPLANT
DRAPE INCISE IOBAN 66X60 STRL (DRAPES) ×3 IMPLANT
ELECT REM PT RETURN 9FT ADLT (ELECTROSURGICAL) ×3
ELECTRODE REM PT RTRN 9FT ADLT (ELECTROSURGICAL) ×1 IMPLANT
GAUZE SPONGE 4X4 12PLY STRL (GAUZE/BANDAGES/DRESSINGS) ×3 IMPLANT
GAUZE XEROFORM 1X8 LF (GAUZE/BANDAGES/DRESSINGS) ×3 IMPLANT
GLOVE BIO SURGEON STRL SZ8 (GLOVE) ×3 IMPLANT
GLOVE BIOGEL PI IND STRL 8.5 (GLOVE) ×1 IMPLANT
GLOVE BIOGEL PI INDICATOR 8.5 (GLOVE) ×2
GLOVE INDICATOR 8.0 STRL GRN (GLOVE) ×3 IMPLANT
GLOVE SURG ORTHO 8.0 STRL STRW (GLOVE) ×9 IMPLANT
GOWN STRL REUS W/ TWL LRG LVL3 (GOWN DISPOSABLE) ×1 IMPLANT
GOWN STRL REUS W/ TWL XL LVL3 (GOWN DISPOSABLE) ×1 IMPLANT
GOWN STRL REUS W/TWL LRG LVL3 (GOWN DISPOSABLE) ×2
GOWN STRL REUS W/TWL XL LVL3 (GOWN DISPOSABLE) ×2
HOOD PEEL AWAY FLYTE STAYCOOL (MISCELLANEOUS) ×9 IMPLANT
INSERT ARTI HI FLEX 9 SZ 3-4 (Insert) ×3 IMPLANT
IV NS 1000ML (IV SOLUTION) ×2
IV NS 1000ML BAXH (IV SOLUTION) ×1 IMPLANT
KIT PATIENT ADPT GUIDE RT F4 (DISPOSABLE) ×3 IMPLANT
KIT TURNOVER KIT A (KITS) ×3 IMPLANT
MAT ABSORB  FLUID 56X50 GRAY (MISCELLANEOUS)
MAT ABSORB FLUID 56X50 GRAY (MISCELLANEOUS) IMPLANT
NDL SAFETY ECLIPSE 18X1.5 (NEEDLE) ×1 IMPLANT
NEEDLE HYPO 18GX1.5 SHARP (NEEDLE) ×2
NEEDLE SPNL 20GX3.5 QUINCKE YW (NEEDLE) ×3 IMPLANT
NS IRRIG 1000ML POUR BTL (IV SOLUTION) ×3 IMPLANT
PACK TOTAL KNEE (MISCELLANEOUS) ×3 IMPLANT
PAD DE MAYO PRESSURE PROTECT (MISCELLANEOUS) ×3 IMPLANT
PAD WRAPON POLAR KNEE (MISCELLANEOUS) ×1 IMPLANT
PULSAVAC PLUS IRRIG FAN TIP (DISPOSABLE) ×3
STAPLER SKIN PROX 35W (STAPLE) ×3 IMPLANT
SUCTION FRAZIER HANDLE 10FR (MISCELLANEOUS) ×2
SUCTION TUBE FRAZIER 10FR DISP (MISCELLANEOUS) ×1 IMPLANT
SUT DVC 2 QUILL PDO  T11 36X36 (SUTURE) ×2
SUT DVC 2 QUILL PDO T11 36X36 (SUTURE) ×1 IMPLANT
SUT VIC AB 2-0 CT1 18 (SUTURE) ×3 IMPLANT
SUT VIC AB 2-0 CT1 27 (SUTURE)
SUT VIC AB 2-0 CT1 TAPERPNT 27 (SUTURE) IMPLANT
SUT VIC AB PLUS 45CM 1-MO-4 (SUTURE) ×3 IMPLANT
SYR 30ML LL (SYRINGE) ×9 IMPLANT
TIBIAL BASEPLATE SZ 3 (Knees) ×3 IMPLANT
TIP FAN IRRIG PULSAVAC PLUS (DISPOSABLE) ×1 IMPLANT
WRAPON POLAR PAD KNEE (MISCELLANEOUS) ×3

## 2019-02-01 NOTE — Anesthesia Preprocedure Evaluation (Addendum)
Anesthesia Evaluation  Patient identified by MRN, date of birth, ID band Patient awake    Reviewed: Allergy & Precautions, H&P , NPO status , Patient's Chart, lab work & pertinent test results, reviewed documented beta blocker date and time   Airway Mallampati: II   Neck ROM: full    Dental  (+) Poor Dentition   Pulmonary neg pulmonary ROS,    Pulmonary exam normal        Cardiovascular Exercise Tolerance: Good negative cardio ROS Normal cardiovascular exam Rhythm:regular Rate:Normal     Neuro/Psych  Headaches, negative psych ROS   GI/Hepatic Neg liver ROS, GERD  Medicated,  Endo/Other  negative endocrine ROS  Renal/GU negative Renal ROS  negative genitourinary   Musculoskeletal   Abdominal   Peds  Hematology negative hematology ROS (+)   Anesthesia Other Findings Past Medical History: No date: Arthritis No date: Connective tissue disorder ([place])     Comment:  Unspecified, previously followed by [facility]. No date: GERD (gastroesophageal reflux disease) No date: Glaucoma No date: Migraine Past Surgical History: No date: CARPAL TUNNEL RELEASE; Bilateral No date: CATARACT EXTRACTION; Bilateral No date: CERVICAL SPINE SURGERY     Comment:  C5/C6 repair.  Previous right arm radiculopathy resolved              with surgery.   Reproductive/Obstetrics negative OB ROS                             Anesthesia Physical Anesthesia Plan  ASA: II  Anesthesia Plan: Spinal   Post-op Pain Management:    Induction:   PONV Risk Score and Plan:   Airway Management Planned:   Additional Equipment:   Intra-op Plan:   Post-operative Plan:   Informed Consent: I have reviewed the patients History and Physical, chart, labs and discussed the procedure including the risks, benefits and alternatives for the proposed anesthesia with the patient or authorized representative who has indicated  his/her understanding and acceptance.     Dental Advisory Given  Plan Discussed with: CRNA  Anesthesia Plan Comments:        Anesthesia Quick Evaluation

## 2019-02-01 NOTE — Transfer of Care (Signed)
Immediate Anesthesia Transfer of Care Note  Patient: Denise Norris  Procedure(s) Performed: TOTAL KNEE ARTHROPLASTY (Right Knee)  Patient Location: PACU  Anesthesia Type:Spinal  Level of Consciousness: awake, alert  and oriented  Airway & Oxygen Therapy: Patient Spontanous Breathing  Post-op Assessment: Report given to RN and Post -op Vital signs reviewed and stable  Post vital signs: Reviewed and stable  Last Vitals:  Vitals Value Taken Time  BP 112/71 02/01/19 1544  Temp    Pulse 69 02/01/19 1547  Resp 13 02/01/19 1547  SpO2 99 % 02/01/19 1547  Vitals shown include unvalidated device data.  Last Pain:  Vitals:   02/01/19 1251  TempSrc: Tympanic  PainSc: 0-No pain         Complications: No apparent anesthesia complications

## 2019-02-01 NOTE — Plan of Care (Signed)
  Problem: Education: Goal: Knowledge of General Education information will improve Description: Including pain rating scale, medication(s)/side effects and non-pharmacologic comfort measures Outcome: Progressing   Problem: Clinical Measurements: Goal: Ability to maintain clinical measurements within normal limits will improve Outcome: Progressing Goal: Will remain free from infection Outcome: Progressing   Problem: Activity: Goal: Risk for activity intolerance will decrease Outcome: Progressing   Problem: Pain Managment: Goal: General experience of comfort will improve Outcome: Progressing   Problem: Safety: Goal: Ability to remain free from injury will improve Outcome: Progressing   Problem: Skin Integrity: Goal: Risk for impaired skin integrity will decrease Outcome: Progressing   Problem: Activity: Goal: Ability to avoid complications of mobility impairment will improve Outcome: Progressing Goal: Ability to tolerate increased activity will improve Outcome: Progressing   Problem: Education: Goal: Verbalization of understanding the information provided will improve Outcome: Progressing   Problem: Coping: Goal: Level of anxiety will decrease Outcome: Progressing   Problem: Physical Regulation: Goal: Postoperative complications will be avoided or minimized Outcome: Progressing   Problem: Skin Integrity: Goal: Signs of wound healing will improve Outcome: Progressing   Problem: Tissue Perfusion: Goal: Ability to maintain adequate tissue perfusion will improve Outcome: Progressing

## 2019-02-01 NOTE — Progress Notes (Signed)
   02/01/19 2000  Clinical Encounter Type  Visited With Patient  Visit Type Initial  Referral From Nurse  Consult/Referral To Chaplain  Spiritual Encounters  Spiritual Needs Prayer;Emotional;Grief support   Chaplain received an OR to provide grief support to the patient in the wake of the death of her 68 year old granddaughter two weeks ago and the death of her husband 8 months ago. Upon arrival, the patient was sitting up in her bed with the lights on. She was awake, alert, oriented and welcoming. The patient expressed gratitude for the visit and shared what brought her to the hospital, as well as where she is emotionally. The patient shared the recent major losses of her dear granddaughter and husband, who received care at [facility]. The patient was tearful as she talked about her granddaughter and the unimaginable pain and grief their family is experiencing. The patient shared that she is a person of strong [place] faith who knows that God is with her, but right now, she has a real need to feel God's presence and to hear God's direction about how to go forward. This chaplain provided support in the form of active and reflective listening, empathy, compassionate ministerial presence, and prayer. The chaplain also offered a prayer shawl as a tangible reminder of God's presence and steadfast love. The patient expressed gratitude and welcomes follow-up.  Follow-up: Continue to provide grief support in the wake of the two major losses suffered by the patient and her family.

## 2019-02-01 NOTE — Op Note (Signed)
DATE OF SURGERY:  02/01/2019 TIME: 3:33 PM  PATIENT NAME:  Denise Norris   AGE: 68 y.o.    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   PREOPERATIVE INDICATIONS:  Denise Norris is an 68 y.o. 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  Radiographs have demonstrated tricompartmental osteoarthritis joint space narrowing, osteophytes, subchondral sclerosis and cyst formation.  The risks, benefits, and alternatives were discussed at length including but not limited to the risks of infection, bleeding, nerve or blood vessel injury, knee stiffness, fracture, dislocation, loosening or failure of the hardware and the need for further surgery. Medical risks include but not limited to DVT and pulmonary embolism, myocardial infarction, stroke, pneumonia, respiratory failure and death. I discussed these risks with the patient in my office prior to the date of surgery. They understood these risks and were willing to proceed.  OPERATIVE FINDINGS AND UNIQUE ASPECTS OF THE CASE:  All three compartments with advanced and severe degenerative changes, large osteophytes and an abundance of synovial fluid. Significant deformity was also noted. A decision was made to proceed with total knee arthroplasty.   OPERATIVE DESCRIPTION:  The patient was brought  to the operative room and placed in a supine position after undergoing placement of a general anesthetic. IV antibiotics were given. Patient received tranexamic acid. The lower extremity was prepped and draped in the usual sterile fashion.  A time out was performed to verify the patient's name, date of birth, medical record number, correct site of surgery and correct procedure to be performed. The timeout was also used to confirm the patient received antibiotics and that appropriate instruments, implants and radiographs studies were available in the room.  The leg was elevated and exsanguinated with an Esmarch and the tourniquet was inflated to 275 mmHg.  A midline incision was made over the left knee.. A medial parapatellar arthrotomy was then made and the patella subluxed laterally and the knee was brought into 90 of flexion. Hoffa's fat pad along with the anterior cruciate ligament was resected and the medial joint line was exposed.  Attention was then turned to preparation of the patella. The thickness of the patella was measured with a caliper, the diameter measured with the patella templates.  The patella resection was then made with an oscillating saw using the patella cutting guide.  The 29 mm button fit appropriately.  3 peg holes for the patella component were then drilled.  The extramedullary tibial cutting guide was then placed using the anterior tibial crest and second ray of the foot as a reference.  The tibial cutting guide was adjusted to allow for appropriate posterior slope.  The tibial cutting block was pinned into position. The slotted stylus was used to measure the proximal tibial resection of 9 mm off the high lateral side. Care was taken during the tibial resection to protect the medial and collateral ligaments.  The resected tibial bone was removed.  The distal femur was resected using the Visionaire cutting guide.  Care was taken to protect the collateral ligaments during distal  femoral resection.  The distal femoral resection was performed with an oscillating saw. The femoral cutting guide was then removed. Extension gap was measured with a 9 mm spacer block and alignment and extension was confirmed using a long alignment rod. The femur was sized to be a 4 narrow. Rotation of the referencing guide was checked with the epicondylar axis and Whitesides line. Then the 4-in-1 cutting jig was then applied to the distal femur. A stylus was used to confirm that the anterior femur would not be notched.   Then the anterior, posterior and chamfer femoral cuts were then made with an oscillating saw.  The knee was distracted and all posterior osteophytes were removed.  The flexion gap was then measured with a flexion spacer block and long alignment rod and was found to be symmetric with the extension gap and perpendicular to mechanical axis of the tibia.  The proximal tibia plateau was then sized with trial trays. The best coverage was achieved with a size 3. This tibial tray was then pinned into position. The proximal tibia was then prepared with the keel punch.  After tibial preparation was completed, all trial components were inserted with polyethylene trials. The knee achieved full extension and flexed to 120 degrees. Ligament were stable to varus and valgus at full extension as well as 30, 60 and 90 degrees of flexion.   The trials were then placed. Knee was taken through a full range of motion and deemed to be stable with the trial components. All trial components were then removed.  The joint was copiously irrigated with pulse lavage.  The final total knee arthroplasty components were then cemented into place. The knee was held in extension while cement was allowed to cure.The knee was taken through a range of motion and the patella tracked well and the knee was again irrigated copiously.  The knee capsule was then injected with Exparel.  The medial arthrotomy was closed with #1 Vicryl  and #2 Quill. The subcutaneous tissue closed with  2-0 vicryl, and skin approximated with staples.  A dry sterile and compressive dressing was applied.  A Polar Care was applied to the operative knee.  The patient was awakened and brought to the PACU in stable and satisfactory condition.  All sharp, lap and instrument counts were correct at the conclusion the case. I spoke with the patient's family in the postop consultation room to let them know the case had been performed without complication and the patient was stable in recovery room.

## 2019-02-01 NOTE — Anesthesia Procedure Notes (Addendum)
Anesthesia Regional Block: Adductor canal block   Pre-Anesthetic Checklist: ,, timeout performed, Correct Patient, Correct Site, Correct Laterality, Correct Procedure, Correct Position, site marked, Risks and benefits discussed,  Surgical consent,  Pre-op evaluation,  At surgeon's request and post-op pain management  Laterality: Right  Prep: chloraprep       Needles:   Needle Type: Echogenic Stimulator Needle     Needle Length: 10cm  Needle Gauge: 20     Additional Needles:   Procedures: Doppler guided,,,, ultrasound used (permanent image in chart),,,,  Narrative:  Anesthesiologist: Molli Barrows, MD  Additional Notes: Pt tolerated the procedure without difficulty and there were no iv sx or pain upon injection.  JA

## 2019-02-01 NOTE — Anesthesia Procedure Notes (Signed)
Spinal  Patient location during procedure: OR Start time: 02/01/2019 1:55 PM End time: 02/01/2019 2:00 PM Staffing Performed: resident/CRNA  Preanesthetic Checklist Completed: patient identified, surgical consent, pre-op evaluation, timeout performed, IV checked, risks and benefits discussed and monitors and equipment checked Spinal Block Patient position: sitting Prep: ChloraPrep Patient monitoring: heart rate, continuous pulse ox, blood pressure and cardiac monitor Approach: midline Location: L3-4 Injection technique: single-shot Needle Needle type: Quincke  Needle gauge: 22 G Needle length: 9 cm Catheter type: closed end flexible Additional Notes Negative paresthesia. Negative blood return. Positive free-flowing CSF. Expiration date of kit checked and confirmed. Patient tolerated procedure well, without complications.

## 2019-02-01 NOTE — H&P (Signed)
The patient has been re-examined, and the chart reviewed, and there have been no interval changes to the documented history and physical.  Plan a right total knee today.  Anesthesia is consulted regarding a peripheral nerve block for post-operative pain.  The risks, benefits, and alternatives have been discussed at length, and the patient is willing to proceed.     

## 2019-02-01 NOTE — Anesthesia Post-op Follow-up Note (Signed)
Anesthesia QCDR form completed.        

## 2019-02-02 ENCOUNTER — Encounter: Payer: Self-pay | Admitting: Orthopedic Surgery

## 2019-02-02 LAB — BASIC METABOLIC PANEL
Anion gap: 8 (ref 5–15)
BUN: 18 mg/dL (ref 8–23)
CO2: 26 mmol/L (ref 22–32)
Calcium: 8.6 mg/dL — ABNORMAL LOW (ref 8.9–10.3)
Chloride: 106 mmol/L (ref 98–111)
Creatinine, Ser: 0.58 mg/dL (ref 0.44–1.00)
GFR calc Af Amer: >60 mL/min (ref >60)
GFR calc non Af Amer: >60 mL/min (ref >60)
Glucose, Bld: 123 mg/dL — ABNORMAL HIGH (ref 70–99)
Potassium: 3.8 mmol/L (ref 3.5–5.1)
Sodium: 140 mmol/L (ref 135–145)

## 2019-02-02 LAB — CBC
HCT: 32.3 % — ABNORMAL LOW (ref 36.0–46.0)
Hemoglobin: 10.6 g/dL — ABNORMAL LOW (ref 12.0–15.0)
MCH: 30.4 pg (ref 26.0–34.0)
MCHC: 32.8 g/dL (ref 30.0–36.0)
MCV: 92.6 fL (ref 80.0–100.0)
Platelets: 209 10*3/uL (ref 150–400)
RBC: 3.49 MIL/uL — ABNORMAL LOW (ref 3.87–5.11)
RDW: 12.7 % (ref 11.5–15.5)
WBC: 11 10*3/uL — ABNORMAL HIGH (ref 4.0–10.5)
nRBC: 0 % (ref 0.0–0.2)

## 2019-02-02 NOTE — Anesthesia Postprocedure Evaluation (Signed)
Anesthesia Post Note  Patient: Denise Norris  Procedure(s) Performed: TOTAL KNEE ARTHROPLASTY (Right Knee)  Patient location during evaluation: Nursing Unit Anesthesia Type: Spinal Level of consciousness: oriented and awake and alert Pain management: pain level controlled Vital Signs Assessment: post-procedure vital signs reviewed and stable Respiratory status: spontaneous breathing and respiratory function stable Cardiovascular status: blood pressure returned to baseline and stable Postop Assessment: no headache, no backache, no apparent nausea or vomiting and patient able to bend at knees Anesthetic complications: no     Last Vitals:  Vitals:   02/01/19 2223 02/02/19 0153  BP: (!) 150/91 (!) 150/88  Pulse: 67 67  Resp: 18 19  Temp: 36.9 C 36.7 C  SpO2: 99% 96%    Last Pain:  Vitals:   02/02/19 0153  TempSrc: Oral  PainSc:                  [name]

## 2019-02-02 NOTE — NC FL2 (Signed)
[place] LEVEL OF CARE SCREENING TOOL     IDENTIFICATION  Patient Name: Denise Norris Birthdate: Aug 29, 1950 Sex: female Admission Date (Current Location): 02/01/2019  [place] and [place] Number:  Engineering geologist and Address:  [facility], [address], [place], [place] [address]      Provider Number: [id number]  Attending Physician Name and Address:  Lovell Sheehan, MD  Relative Name and Phone Number:  Jenesa Foresta [phone]    Current Level of Care: Hospital Recommended Level of Care: [place] Prior Approval Number:    Date Approved/Denied:   PASRR Number: Pending  Discharge Plan: SNF    Current Diagnoses: Patient Active Problem List   Diagnosis Date Noted  . 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 Shoulder pain 03/13/2018  . Caregiver role strain 03/13/2018  . Knee pain 03/13/2018  . Advance care planning 03/13/2018  . Healthcare maintenance 03/13/2018    Orientation RESPIRATION BLADDER Height & Weight     Self, Time, Situation, Place  Normal Continent Weight: 78 kg Height:  5\' 2"  (157.5 cm)  BEHAVIORAL SYMPTOMS/MOOD NEUROLOGICAL BOWEL NUTRITION STATUS      Continent    AMBULATORY STATUS COMMUNICATION OF NEEDS Skin   Extensive Assist Verbally Surgical wounds                       Personal Care Assistance Level of Assistance              Functional Limitations Info             SPECIAL CARE FACTORS FREQUENCY  PT (By licensed PT)     PT Frequency: 5 times per week              Contractures Contractures Info: Not present    Additional Factors Info  Allergies   Allergies Info: Codeine           Current Medications (02/02/2019):  This is the current hospital active medication list Current Facility-Administered Medications  Medication Dose Route Frequency Provider Last Rate Last Dose  . acetaminophen (TYLENOL) tablet 325-650 mg   325-650 mg Oral Q6H PRN Lovell Sheehan, MD      . alum & mag hydroxide-simeth (MAALOX/MYLANTA) 200-200-20 MG/5ML suspension 30 mL  30 mL Oral Q4H PRN Lovell Sheehan, MD      . bisacodyl (DULCOLAX) suppository 10 mg  10 mg Rectal Daily PRN Lovell Sheehan, MD      . diphenhydrAMINE (BENADRYL) 12.5 MG/5ML elixir 12.5-25 mg  12.5-25 mg Oral Q4H PRN Lovell Sheehan, MD      . docusate sodium (COLACE) capsule 100 mg  100 mg Oral BID Lovell Sheehan, MD   100 mg at 02/02/19 0851  . famotidine (PEPCID) tablet 10 mg  10 mg Oral Daily PRN Lovell Sheehan, MD      . HYDROcodone-acetaminophen [facility]) 7.5-325 MG per tablet 1-2 tablet  1-2 tablet Oral Q4H PRN Lovell Sheehan, MD      . HYDROcodone-acetaminophen (NORCO/VICODIN) 5-325 MG per tablet 1-2 tablet  1-2 tablet Oral Q4H PRN Lovell Sheehan, MD      . lactated ringers infusion   Intravenous Continuous Lovell Sheehan, MD   Stopped at 02/02/19 [phone]  . magnesium citrate solution 1 Bottle  1 Bottle Oral Once PRN Lovell Sheehan, MD      . menthol-cetylpyridinium (CEPACOL) lozenge 3 mg  1 lozenge  Oral PRN [name], MD       Or  . phenol (CHLORASEPTIC) mouth spray 1 spray  1 spray Mouth/Throat PRN [name], MD      . methocarbamol (ROBAXIN) tablet 500 mg  500 mg Oral Q6H PRN [name], MD       Or  . methocarbamol (ROBAXIN) 500 mg in dextrose 5 % 50 mL IVPB  500 mg Intravenous Q6H PRN [name], MD      . metoCLOPramide (REGLAN) tablet 5-10 mg  5-10 mg Oral Q8H PRN [name], MD       Or  . metoCLOPramide (REGLAN) injection 5-10 mg  5-10 mg Intravenous Q8H PRN [name], MD      . morphine 2 MG/ML injection 0.5-1 mg  0.5-1 mg Intravenous Q2H PRN [name], MD      . ondansetron [facility]) tablet 4 mg  4 mg Oral Q6H PRN [name], MD       Or  . ondansetron [facility]) injection 4 mg  4 mg Intravenous Q6H PRN [name], MD      . rivaroxaban Carlena Hurl) tablet 10 mg  10 mg Oral Q breakfast [name], MD   10 mg at 02/02/19 4098  . timolol (TIMOPTIC) 0.5 % ophthalmic solution 1 drop  1 drop Both Eyes BID [name], MD   1 drop at 02/02/19 1191     Discharge Medications: Please see discharge summary for a list of discharge medications.  Relevant Imaging Results:  Relevant Lab Results:   Additional Information [id number]  Barrie Dunker, RN

## 2019-02-02 NOTE — Progress Notes (Signed)
Physical Therapy Treatment Patient Details Name: Denise Norris MRN: [id number] DOB: [birth year] Today's Date: 02/02/2019    History of Present Illness Pt 87 female admitted s/p R TKR 02/01/19. PMH includes: connective tissue disorder, C5/C6 repair, CTR    PT Comments    Pt up in recliner upon arrival with son, Corene Cornea present and eager to get up with PT. Pt reports minimal pain. Pt performed seated therex with good carryover from this morning. Pt required min vc for correct performance of therex. Pt progressed ambulation tolerance with improved gait pattern including more weight acceptance on RLE. Pts bandaging began sliding down during ambulation with nursing notified and examining. Pt ended session in bed with all needs met and ice off secondary to bandaging being off. Pt is progressing very well towards PT goals. Pt will benefit from continued skilled rehab to further improve strength, ROM, balance, ambulation and independence with functional mobility.    Follow Up Recommendations  Home health PT     Equipment Recommendations  3in1 (PT);Rolling walker with 5" wheels    Recommendations for Other Services       Precautions / Restrictions Precautions Precautions: Fall Restrictions Weight Bearing Restrictions: Yes RLE Weight Bearing: Weight bearing as tolerated    Mobility  Bed Mobility Overal bed mobility: Needs Assistance Bed Mobility: Sit to Supine       Sit to supine: Min guard;HOB elevated   General bed mobility comments: pt utilized increased time and effort, no physical assist required to return to supine  Transfers Overall transfer level: Needs assistance Equipment used: Rolling walker (2 wheeled) Transfers: Sit to/from Stand Sit to Stand: Min guard         General transfer comment: good carryover with foot/hand placement, min guard for safety, no assist to rise from recliner or BSC, pt steady  Ambulation/Gait Ambulation/Gait assistance: Min guard Gait Distance  (Feet): 150 Feet Assistive device: Rolling walker (2 wheeled) Gait Pattern/deviations: Step-to pattern;Decreased stance time - right;Decreased weight shift to right;Decreased stride length;Trunk flexed;Antalgic Gait velocity: decreased   General Gait Details: pt progressed ambulation distance and gait pattern with increased weight acceptance on RLE, good carryover with RW mgt, no unsteadiness, buckling or LOB   Stairs             Wheelchair Mobility    Modified Rankin (Stroke Patients Only)       Balance Overall balance assessment: Needs assistance   Sitting balance-Leahy Scale: Good Sitting balance - Comments: steady sitting EOB with LE therex     Standing balance-Leahy Scale: Fair Standing balance comment: pt able to stand without UE support from walker, dec standing balance consistent with recent surgery and WBing status                            Cognition Arousal/Alertness: Awake/alert Behavior During Therapy: WFL for tasks assessed/performed Overall Cognitive Status: Within Functional Limits for tasks assessed                                        Exercises Total Joint Exercises Ankle Circles/Pumps: AROM;Both;10 reps Quad Sets: AROM;Both;10 reps Long Arc Quad: AROM;Both;10 reps Knee Flexion: AROM;Both;10 reps    General Comments        Pertinent Vitals/Pain Faces Pain Scale: Hurts a little bit Pain Location: R knee Pain Descriptors / Indicators: Sore;Aching Pain Intervention(s): Limited activity within patient's  tolerance;Monitored during session;Repositioned    Home Living                      Prior Function            PT Goals (current goals can now be found in the care plan section) Progress towards PT goals: Progressing toward goals    Frequency    BID      PT Plan Current plan remains appropriate    Co-evaluation              AM-PAC PT "6 Clicks" Mobility   Outcome Measure  Help  needed turning from your back to your side while in a flat bed without using bedrails?: A Little Help needed moving from lying on your back to sitting on the side of a flat bed without using bedrails?: A Little Help needed moving to and from a bed to a chair (including a wheelchair)?: A Little Help needed standing up from a chair using your arms (e.g., wheelchair or bedside chair)?: A Little Help needed to walk in hospital room?: A Little Help needed climbing 3-5 steps with a railing? : A Little 6 Click Score: 18    End of Session Equipment Utilized During Treatment: Gait belt Activity Tolerance: Patient tolerated treatment well Patient left: in bed;with call bell/phone within reach;with SCD's reapplied;with family/visitor present Nurse Communication: Mobility status PT Visit Diagnosis: Difficulty in walking, not elsewhere classified (R26.2);Muscle weakness (generalized) (M62.81);Pain Pain - Right/Left: Right Pain - part of body: Knee     Time: 8483-5075 PT Time Calculation (min) (ACUTE ONLY): 25 min  Charges:  $Therapeutic Exercise: 8-22 mins                     Virgilio Broadhead PT, DPT 5:25 PM,02/02/19 [phone]

## 2019-02-02 NOTE — Progress Notes (Signed)
Pt surgical dressing came undone as she was walking with therapy , Verbal order to reinforce with Honeycomb dressing from Dr. Harlow Mares. Removed ABD and gauze and ace wrap , moderate amount of serosanguineous drainage noted. Staples intact and wound edges approximated.  Swelling noted. Returned polar care to Right knee , continues to deny pain or discomfort. Will continue to monitor for changes.

## 2019-02-02 NOTE — Progress Notes (Signed)
  Subjective:  Patient reports pain as mild.    Objective:   VITALS:   Vitals:   02/01/19 2223 02/02/19 0153 02/02/19 0735 02/02/19 1133  BP: (!) 150/91 (!) 150/88 138/86 120/68  Pulse: 67 67 65 72  Resp: 18 19 18 18   Temp: 98.5 F (36.9 C) 98 F (36.7 C) 98.3 F (36.8 C) 98.6 F (37 C)  TempSrc: Oral Oral Oral   SpO2: 99% 96% 98% 100%  Weight:      Height:        PHYSICAL EXAM:  ABD soft Sensation intact distally Dorsiflexion/Plantar flexion intact Incision: dressing C/D/I No cellulitis present Compartment soft  LABS  Results for orders placed or performed during the hospital encounter of 02/01/19 (from the past 24 hour(s))  Type and screen [place]     Status: None   Collection Time: 02/01/19 12:50 PM  Result Value Ref Range   ABO/RH(D) AB POS    Antibody Screen NEG    Sample Expiration      02/04/2019,2359 Performed at [facility], [address] [place], [place] [address]   CBC     Status: Abnormal   Collection Time: 02/02/19  5:05 AM  Result Value Ref Range   WBC 11.0 (H) 4.0 - 10.5 K/uL   RBC 3.49 (L) 3.87 - 5.11 MIL/uL   Hemoglobin 10.6 (L) 12.0 - 15.0 g/dL   HCT 32.3 (L) 36.0 - 46.0 %   MCV 92.6 80.0 - 100.0 fL   MCH 30.4 26.0 - 34.0 pg   MCHC 32.8 30.0 - 36.0 g/dL   RDW 12.7 11.5 - 15.5 %   Platelets 209 150 - 400 K/uL   nRBC 0.0 0.0 - 0.2 %  Basic metabolic panel     Status: Abnormal   Collection Time: 02/02/19  5:05 AM  Result Value Ref Range   Sodium 140 135 - 145 mmol/L   Potassium 3.8 3.5 - 5.1 mmol/L   Chloride 106 98 - 111 mmol/L   CO2 26 22 - 32 mmol/L   Glucose, Bld 123 (H) 70 - 99 mg/dL   BUN 18 8 - 23 mg/dL   Creatinine, Ser 0.58 0.44 - 1.00 mg/dL   Calcium 8.6 (L) 8.9 - 10.3 mg/dL   GFR calc non Af Amer >60 >60 mL/min   GFR calc Af Amer >60 >60 mL/min   Anion gap 8 5 - 15    Dg Knee Right Port  Result Date: 02/01/2019 CLINICAL DATA:  Right knee replacement EXAM: PORTABLE RIGHT KNEE - 2  VIEW COMPARISON:  None. FINDINGS: Knee prosthesis is noted. No soft tissue or bony abnormality is seen. IMPRESSION: Status post right knee replacement. Electronically Signed   By: Inez Catalina M.D.   On: 02/01/2019 19:25   [place] Or Nerve Block-image Only ([facility])  Result Date: 02/01/2019 There is no interpretation for this exam.  This order is for images obtained during a surgical procedure.  Please See "Surgeries" Tab for more information regarding the procedure.    Assessment/Plan: 1 Day Post-Op   Active Problems:   S/P TKR (total knee replacement) using cement, right   Up with therapy Plan for discharge tomorrow  Dressing change tomorrow   Lovell Sheehan , MD 02/02/2019, 12:47 PM

## 2019-02-02 NOTE — Evaluation (Signed)
Physical Therapy Evaluation Patient Details Name: Denise Norris MRN: [id number] DOB: 1950-11-23 Today's Date: 02/02/2019   History of Present Illness  Pt 68 female admitted s/p R TKR 02/01/19. PMH includes: connective tissue disorder, C5/C6 repair, CTR  Clinical Impression  Pt admitted s/p R TKR. Pt in bed upon arrival and eager to participate with PT. Pt presents with decreased ROM, strength, balance and activity tolerance limiting functional mobility. Pt lives alone and is planning to go home with her son and daughter in law but is extremely worried about being a burden to them and stating she was also open for going to rehab for a few days after discharge in order to improve mobility to decrease burden on her family. Pt required min guard assist for functional mobility. Pt educated on HEP with handout provided and pt familiar with most exercises from PT prior to surgery. Pt required min VC for correct performance of therex. Pt R knee ROM -5-65 deg limited in flexion by bandaging. Pt would benefit from continued skilled acute therapy to improve deficits and independence. Pt would benefit from Home health PT after hospital discharge to further improve deficits, increase independence, reduce fall risk, and allow for return to PLOF.     Follow Up Recommendations Home health PT    Equipment Recommendations  3in1 (PT);Rolling walker with 5" wheels    Recommendations for Other Services       Precautions / Restrictions Precautions Precautions: Fall Restrictions Weight Bearing Restrictions: Yes RLE Weight Bearing: Weight bearing as tolerated      Mobility  Bed Mobility Overal bed mobility: Needs Assistance Bed Mobility: Supine to Sit     Supine to sit: Min guard;HOB elevated     General bed mobility comments: pt utilized increased time and effort but not physical assist required to get EOB  Transfers Overall transfer level: Needs assistance Equipment used: Rolling walker (2  wheeled) Transfers: Sit to/from [employer] Sit to Stand: Min guard Stand pivot transfers: Min guard       General transfer comment: Pt required vc for hand/foot placement, pt able to rise without assist from bed and BSC, pt steady with transfer and initial standing  Ambulation/Gait Ambulation/Gait assistance: Min guard Gait Distance (Feet): 100 Feet Assistive device: Rolling walker (2 wheeled) Gait Pattern/deviations: Step-to pattern;Decreased stance time - right;Decreased weight shift to right;Decreased stride length;Trunk flexed;Antalgic Gait velocity: decreased   General Gait Details: pt ambulated with decreased gait speed and mild antalgic gait pattern, pt required cuing for maintaining upright posture in RW as she likes to let it get out in front of her, pt steady with ambulation with no unsteadiness, buckling or LOB noted  Stairs            Wheelchair Mobility    Modified Rankin (Stroke Patients Only)       Balance Overall balance assessment: Needs assistance Sitting-balance support: Feet supported Sitting balance-Leahy Scale: Good Sitting balance - Comments: steady sitting EOB with LE therex     Standing balance-Leahy Scale: Fair Standing balance comment: pt able to stand without UE support from walker                             Pertinent Vitals/Pain Pain Assessment: Faces Faces Pain Scale: Hurts a little bit Pain Location: R knee Pain Descriptors / Indicators: Sore;Aching Pain Intervention(s): Limited activity within patient's tolerance;Monitored during session;Repositioned;Ice applied    Home Living Family/patient expects to be discharged to::  Private residence Living Arrangements: Alone Available Help at Discharge: Neighbor;Available PRN/intermittently;Family(pt has a neighbor who can check on her if needed, pt plans to go home with son and daughter in law for a few weeks after discharge) Type of Home: House Home Access:  Stairs to enter   [employer] CorporationEntrance Stairs-Number of Steps: 7(at sons house) Home Layout: Multi-level;Able to live on main level with bedroom/bathroom(at sons house they have set her up a bedroom and bathroom on main level, her home is one level) Home Equipment: Cane - single point;Shower seat      Prior Function Level of Independence: Independent         Comments: pt reports being completely independent with all ADLs/IADLs, 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, 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 and able to get on floor to play with grandson having to use chair and increased effort to get off of floor, pt has been ambulating outside [facility], normally at sons house pt has to climb 13 steps to her bedroom there and has been managing although difficult and has been going up stairs sideways, pt does report 2 falls in last year     Hand Dominance        Extremity/Trunk Assessment   Upper Extremity Assessment Upper Extremity Assessment: Overall WFL for tasks assessed    Lower Extremity Assessment Lower Extremity Assessment: RLE deficits/detail RLE Deficits / Details: decreased strength, ROM and balance consistent with recent TKR    Cervical / Trunk Assessment Cervical / Trunk Assessment: Normal  Communication   Communication: No difficulties  Cognition Arousal/Alertness: Awake/alert Behavior During Therapy: WFL for tasks assessed/performed Overall Cognitive Status: Within Functional Limits for tasks assessed                                        General Comments      Exercises Total Joint Exercises Ankle Circles/Pumps: AROM;Both;10 reps Quad Sets: AROM;Right;10 reps Hip ABduction/ADduction: AROM;Both;10 reps Straight Leg Raises: AROM;Both;10 reps Long Arc Quad: AROM;Both;10 reps Knee Flexion: AROM;Both;10 reps Goniometric ROM: R knee ROM -5 extension - 65 deg flexion   Assessment/Plan    PT Assessment Patient needs continued PT  services  PT Problem List Decreased strength;Decreased mobility;Decreased range of motion;Decreased activity tolerance;Decreased balance;Pain;Decreased knowledge of use of DME       PT Treatment Interventions DME instruction;Therapeutic exercise;Gait training;Balance training;Stair training;Neuromuscular re-education;Functional mobility training;Therapeutic activities;Patient/family education    PT Goals (Current goals can be found in the Care Plan section)  Acute Rehab PT Goals Patient Stated Goal: return to daily activities PT Goal Formulation: With patient Time For Goal Achievement: 02/16/19 Potential to Achieve Goals: Good    Frequency BID   Barriers to discharge Other (comment) pt doesnt have help at home but does have a neighbor that can pop in as needed, pt plans to stay a few weeks with son and daughter in law post discharge but is extremely worried about being a burden to them as they have a lot going on and just lost a young child, pt stated that she was kinda interested in trying to go to a facility for rehab for a bit to ease burden on son    Co-evaluation               AM-PAC PT "6 Clicks" Mobility  Outcome Measure Help needed turning from your back  to your side while in a flat bed without using bedrails?: A Little Help needed moving from lying on your back to sitting on the side of a flat bed without using bedrails?: A Little Help needed moving to and from a bed to a chair (including a wheelchair)?: A Little Help needed standing up from a chair using your arms (e.g., wheelchair or bedside chair)?: A Little Help needed to walk in hospital room?: A Little Help needed climbing 3-5 steps with a railing? : A Little 6 Click Score: 18    End of Session Equipment Utilized During Treatment: Gait belt Activity Tolerance: Patient tolerated treatment well Patient left: in chair;with call bell/phone within reach;with SCD's reapplied Nurse Communication: Mobility status PT  Visit Diagnosis: Difficulty in walking, not elsewhere classified (R26.2);Muscle weakness (generalized) (M62.81);Pain Pain - Right/Left: Right Pain - part of body: Knee    Time: 1610-9604 PT Time Calculation (min) (ACUTE ONLY): 47 min   Charges:   PT Evaluation $PT Eval Low Complexity: 1 Low PT Treatments $Therapeutic Exercise: 8-22 mins       Akio Hudnall PT, DPT 11:16 AM,02/02/19 [phone]

## 2019-02-02 NOTE — Care Management Important Message (Signed)
Important Message  Patient Details  Name: Denise Norris MRN: [id number] Date of Birth: 1951/01/30   Medicare Important Message Given:  Yes  Initial Medicare IM given by Patient Access Associate on 02/02/2019 at 10:51am.     Dannette Barbara 02/02/2019, 2:42 PM

## 2019-02-02 NOTE — TOC Initial Note (Signed)
Transition of Care [facility]) - Initial/Assessment Note    Patient Details  Name: Denise Norris MRN: [id number] Date of Birth: [birth year]  Transition of Care [facility]) CM/SW Contact:    Su Hilt, RN Phone Number: 02/02/2019, 1:58 PM  Clinical Narrative:                 Met with the patient to discuss DC plan and needs\ She would like to go to rehab after discharge for a few days to get stronger, her grand daughter of 65 years old passed away 2 weeks ago after contracting ecoli and she stated that going to her sons right after DC would put a burden on the family The patient lives alone but would need to stay with her son at discharge to be able to have assistance, She agreed to [facility] and [facility] and doing a bed search, FL2 completed and PASSR is pending due to the patient living outside of [place] and going up for manual review Bed search sent thru the Hub, awaiting bed offers then will review them in detail with the patient  Expected Discharge Plan: Skilled Nursing Facility Barriers to Discharge: Continued Medical Work up   Patient Goals and CMS Choice Patient states their goals for this hospitalization and ongoing recovery are:: go to rehab and get stronger      Expected Discharge Plan and Services Expected Discharge Plan: [place]   Discharge Planning Services: CM Consult   Living arrangements for the past 2 months: [place]                 DME Arranged: N/A         HH Arranged: NA          Prior Living Arrangements/Services Living arrangements for the past 2 months: [place] Lives with:: Self Patient language and need for interpreter reviewed:: Yes Do you feel safe going back to the place where you live?: Yes      Need for Family Participation in Patient Care: No (Comment) Care giver support system in place?: Yes (comment)   Criminal Activity/Legal Involvement Pertinent to Current Situation/Hospitalization: No - Comment as  needed  Activities of Daily Living Home Assistive Devices/Equipment: None ADL Screening (condition at time of admission) Patient's cognitive ability adequate to safely complete daily activities?: Yes Is the patient deaf or have difficulty hearing?: No Does the patient have difficulty seeing, even when wearing glasses/contacts?: No Does the patient have difficulty concentrating, remembering, or making decisions?: No Patient able to express need for assistance with ADLs?: Yes Does the patient have difficulty dressing or bathing?: No Independently performs ADLs?: Yes (appropriate for developmental age) Does the patient have difficulty walking or climbing stairs?: Yes Weakness of Legs: Both Weakness of Arms/Hands: None  Permission Sought/Granted   Permission granted to share information with : Yes, Verbal Permission Granted              Emotional Assessment Appearance:: Appears stated age Attitude/Demeanor/Rapport: Engaged Affect (typically observed): Appropriate, Calm Orientation: : Oriented to Self, Oriented to Place, Oriented to  Time, Oriented to Situation Alcohol / Substance Use: Not Applicable Psych Involvement: No (comment)  Admission diagnosis:  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 Shoulder pain 03/13/2018  . Caregiver role strain 03/13/2018  . Knee pain 03/13/2018  . Advance care planning 03/13/2018  . Healthcare maintenance 03/13/2018  PCP:  Tonia Ghent, MD Pharmacy:   [facility] [address] [place] [address] [place] [address] Phone: [phone] Fax: [phone]     Social Determinants of Health (SDOH) Interventions    Readmission Risk Interventions No flowsheet data found.

## 2019-02-03 LAB — CBC
HCT: 28.8 % — ABNORMAL LOW (ref 36.0–46.0)
Hemoglobin: 9.4 g/dL — ABNORMAL LOW (ref 12.0–15.0)
MCH: 30.8 pg (ref 26.0–34.0)
MCHC: 32.6 g/dL (ref 30.0–36.0)
MCV: 94.4 fL (ref 80.0–100.0)
Platelets: 189 10*3/uL (ref 150–400)
RBC: 3.05 MIL/uL — ABNORMAL LOW (ref 3.87–5.11)
RDW: 13.6 % (ref 11.5–15.5)
WBC: 6 10*3/uL (ref 4.0–10.5)
nRBC: 0 % (ref 0.0–0.2)

## 2019-02-03 MED ORDER — METHOCARBAMOL 500 MG PO TABS: 500.0000 mg | ORAL_TABLET | Freq: Four times a day (QID) | ORAL | 1 refills | Status: DC | PRN

## 2019-02-03 MED ORDER — HYDROCODONE-ACETAMINOPHEN 7.5-325 MG PO TABS: 1.0000 | ORAL_TABLET | ORAL | 0 refills | Status: DC | PRN

## 2019-02-03 MED ORDER — RIVAROXABAN 10 MG PO TABS: 10.0000 mg | ORAL_TABLET | Freq: Every day | ORAL | 0 refills | Status: DC

## 2019-02-03 MED ORDER — DOCUSATE SODIUM 100 MG PO CAPS: 100.0000 mg | ORAL_CAPSULE | Freq: Two times a day (BID) | ORAL | 0 refills | Status: DC

## 2019-02-03 NOTE — Progress Notes (Signed)
This chaplain followed up with the patient after having met her on 10/7. Today, the patient was in good spirits, and she was awake, alert, and oriented upon my arrival to her room. The patient described having had a rough night because of pain in her knee. The patient reported that she plans to discharge to her son's home tomorrow and hopes to get on top of the pain curve today. The patient shared her thought process about whether to discharge home or to her son and daughter-in-law's home; she decided to do the latter. 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 The patient described the difficulty with allowing herself to be cared for when she has often been the one providing the care to others. This chaplain provided support in the form of active and reflective listening, empathy, theological reflection, encouragement, and prayer.

## 2019-02-03 NOTE — Progress Notes (Signed)
  Subjective:  Patient reports pain as mild to moderate.  Hurts with PT  Objective:   VITALS:   Vitals:   02/02/19 1932 02/02/19 2324 02/03/19 0834 02/03/19 1635  BP: 126/64 119/60 140/68 136/69  Pulse: 79 69 69 77  Resp: 18 18 17 18   Temp: 98.5 F (36.9 C) 98.9 F (37.2 C) 99.1 F (37.3 C) 99.3 F (37.4 C)  TempSrc: Oral Oral Oral Oral  SpO2: 100% 96% 97% 99%  Weight:      Height:        PHYSICAL EXAM:  Neurologically intact ABD soft Neurovascular intact Sensation intact distally Intact pulses distally Dorsiflexion/Plantar flexion intact Incision: dressing C/D/I No cellulitis present Compartment soft  LABS  Results for orders placed or performed during the hospital encounter of 02/01/19 (from the past 24 hour(s))  CBC     Status: Abnormal   Collection Time: 02/03/19  5:07 AM  Result Value Ref Range   WBC 6.0 4.0 - 10.5 K/uL   RBC 3.05 (L) 3.87 - 5.11 MIL/uL   Hemoglobin 9.4 (L) 12.0 - 15.0 g/dL   HCT 28.8 (L) 36.0 - 46.0 %   MCV 94.4 80.0 - 100.0 fL   MCH 30.8 26.0 - 34.0 pg   MCHC 32.6 30.0 - 36.0 g/dL   RDW 13.6 11.5 - 15.5 %   Platelets 189 150 - 400 K/uL   nRBC 0.0 0.0 - 0.2 %    No results found.  Assessment/Plan: 2 Days Post-Op   Active Problems:   S/P TKR (total knee replacement) using cement, right   Advance diet Up with therapy Discharge home with home health   Patient did well with PT today.  Better pain control.  Anticipate D/C tomorrow morning after PT   Carlynn Spry , PA-C 02/03/2019, 5:03 PM

## 2019-02-03 NOTE — TOC Progression Note (Signed)
Transition of Care [facility]) - Progression Note    Patient Details  Name: Denise Norris MRN: [id number] Date of Birth: May 14, 1950  Transition of Care [facility]) CM/SW [place], RN Phone Number: 02/03/2019, 9:45 AM  Clinical Narrative:     Met with the patient in the room and she called her son on the phone to discuss DC plan, She has decided with her walking 150 ft with PT that she would go to her son's home at DC. She will need a RW and BSC, I notified Brad with [employer].  [facility] is the [facility] agency she would like to use.  I notified [place] with the son's phone number [phone] and address [place] I spoke to [place] and asked if he would electronically send her medications so that her son can pick them up prior to DC. Will continue to monitor for needs   Expected Discharge Plan: [place] Barriers to Discharge: Continued Medical Work up  Expected Discharge Plan and Services Expected Discharge Plan: [place]   Discharge Planning Services: CM Consult   Living arrangements for the past 2 months: Single Family Home                 DME Arranged: 3-N-1, Walker rolling DME Agency: [facility] Date DME Agency Contacted: 02/03/19 Time DME Agency Contacted: [phone] Representative spoke with at DME Agency: [place]: PT [place]: [facility] [place] Date [place]: 02/03/19 Time [place]: [phone] Representative spoke with at [place]: [place] ([place]) Interventions    Readmission Risk Interventions No flowsheet data found.

## 2019-02-03 NOTE — Progress Notes (Signed)
Physical Therapy Treatment Patient Details Name: Denise Norris MRN: [id number] DOB: Apr 18, 1951 Today's Date: 02/03/2019    History of Present Illness Pt 68 female admitted s/p R TKR 02/01/19. PMH includes: connective tissue disorder, C5/C6 repair, CTR    PT Comments    Pt in bed upon arrival and agreed to participate with PT. Pt reported that she was able to get some rest since morning session. Pt continues to report increased pain and stiffness compared to yesterday. Pt rated pain 6/10 end of session with nursing present and providing medication. Pt performed HEP with strong carryover of proper performance but did require min assist with SLR and hip ABD. Pt utilizes a couple rocks in order to gain momentum to stand fully and stands with very flexed trunk but she is overall steady. Pt ambulates with safe gait pattern however becomes more and more antalgic as she fatigues. PT will continue to progress strength, ROM, endurance as appropriate. PT will initiate stair training in the morning. Pt does have 7 stairs to enter sons home but will have sons assistance and the car can be parked very close to stairs. Home health remains appropriate following discharge.    Follow Up Recommendations  Home health PT     Equipment Recommendations  3in1 (PT);Rolling walker with 5" wheels    Recommendations for Other Services       Precautions / Restrictions Precautions Precautions: Fall Restrictions Weight Bearing Restrictions: Yes RLE Weight Bearing: Weight bearing as tolerated    Mobility  Bed Mobility Overal bed mobility: Needs Assistance Bed Mobility: Supine to Sit     Supine to sit: Min assist;HOB elevated Sit to supine: Min assist;HOB elevated   General bed mobility comments: min assist for LE advancement secondary to increased pain, pt utilized increased time and effort to get EOB  Transfers Overall transfer level: Needs assistance Equipment used: Rolling walker (2 wheeled) Transfers:  Sit to/from Stand Sit to Stand: Min guard         General transfer comment: safe with transfers, no assist required however pt utilized a few rocks back and forth in order to gain momentum, able to quickly rise after gathering momentum, good carryover with hand/foot placement and use of RW, very forward flexed trunk during rise however pt is steady   Ambulation/Gait Ambulation/Gait assistance: Supervision Gait Distance (Feet): 150 Feet Assistive device: Rolling walker (2 wheeled) Gait Pattern/deviations: Step-to pattern;Decreased stance time - right;Decreased weight shift to right;Decreased stride length;Trunk flexed;Antalgic Gait velocity: decreased   General Gait Details: pt ambulated safely in hall with mildly antalgic gait pattern however as she becomes more fatigued on return to room pt becomes more antalgic, heavy reliance on UE support from RW, no unsteadiness, buckling or LOB   Stairs             Wheelchair Mobility    Modified Rankin (Stroke Patients Only)       Balance Overall balance assessment: Needs assistance Sitting-balance support: Feet supported Sitting balance-Leahy Scale: Good Sitting balance - Comments: steady sitting EOB with LE therex     Standing balance-Leahy Scale: Good Standing balance comment: pt able to maintain standing balance without UE support in order to wash hands at sink                            Cognition Arousal/Alertness: Awake/alert Behavior During Therapy: WFL for tasks assessed/performed Overall Cognitive Status: Within Functional Limits for tasks assessed  Exercises Total Joint Exercises Ankle Circles/Pumps: AROM;Both;10 reps Quad Sets: AROM;10 reps;Right Hip ABduction/ADduction: AROM;Right;10 reps Straight Leg Raises: AROM;Right;10 reps Long Arc Quad: AROM;Right;10 reps Knee Flexion: AROM;Right;10 reps Goniometric ROM: R knee ROM -6 ext - 65 deg  flexion    General Comments        Pertinent Vitals/Pain Pain Score: 6  Pain Location: R knee Pain Descriptors / Indicators: Sore;Aching;Operative site guarding Pain Intervention(s): Limited activity within patient's tolerance;Monitored during session;Repositioned;Ice applied    Home Living                      Prior Function            PT Goals (current goals can now be found in the care plan section) Progress towards PT goals: Progressing toward goals    Frequency    BID      PT Plan Current plan remains appropriate    Co-evaluation              AM-PAC PT "6 Clicks" Mobility   Outcome Measure  Help needed turning from your back to your side while in a flat bed without using bedrails?: A Little Help needed moving from lying on your back to sitting on the side of a flat bed without using bedrails?: A Little Help needed moving to and from a bed to a chair (including a wheelchair)?: A Little Help needed standing up from a chair using your arms (e.g., wheelchair or bedside chair)?: A Little Help needed to walk in hospital room?: A Little Help needed climbing 3-5 steps with a railing? : A Little 6 Click Score: 18    End of Session Equipment Utilized During Treatment: Gait belt Activity Tolerance: Patient limited by pain Patient left: in chair;with call bell/phone within reach;with SCD's reapplied;with nursing/sitter in room Nurse Communication: Mobility status PT Visit Diagnosis: Difficulty in walking, not elsewhere classified (R26.2);Muscle weakness (generalized) (M62.81);Pain Pain - Right/Left: Right Pain - part of body: Knee     Time: 5009-3818 PT Time Calculation (min) (ACUTE ONLY): 28 min  Charges:  $Therapeutic Exercise: 8-22 mins                     Faysal Fenoglio PT, DPT 3:49 PM,02/03/19 [phone]

## 2019-02-03 NOTE — Progress Notes (Signed)
Pt educated on importance of GI motility after surgery .

## 2019-02-03 NOTE — Progress Notes (Signed)
  Subjective:  Patient reports pain as moderate.  Pain increase overnight.  Objective:   VITALS:   Vitals:   02/02/19 1621 02/02/19 1932 02/02/19 2324 02/03/19 0834  BP: 134/79 126/64 119/60 140/68  Pulse: 77 79 69 69  Resp: 18 18 18 17   Temp: 99 F (37.2 C) 98.5 F (36.9 C) 98.9 F (37.2 C) 99.1 F (37.3 C)  TempSrc: Oral Oral Oral Oral  SpO2: 100% 100% 96% 97%  Weight:      Height:        PHYSICAL EXAM:  Neurologically intact ABD soft Neurovascular intact Sensation intact distally Intact pulses distally Dorsiflexion/Plantar flexion intact Incision: dressing changed No cellulitis present Compartment soft  LABS  Results for orders placed or performed during the hospital encounter of 02/01/19 (from the past 24 hour(s))  CBC     Status: Abnormal   Collection Time: 02/03/19  5:07 AM  Result Value Ref Range   WBC 6.0 4.0 - 10.5 K/uL   RBC 3.05 (L) 3.87 - 5.11 MIL/uL   Hemoglobin 9.4 (L) 12.0 - 15.0 g/dL   HCT 28.8 (L) 36.0 - 46.0 %   MCV 94.4 80.0 - 100.0 fL   MCH 30.8 26.0 - 34.0 pg   MCHC 32.6 30.0 - 36.0 g/dL   RDW 13.6 11.5 - 15.5 %   Platelets 189 150 - 400 K/uL   nRBC 0.0 0.0 - 0.2 %    Dg Knee Right Port  Result Date: 02/01/2019 CLINICAL DATA:  Right knee replacement EXAM: PORTABLE RIGHT KNEE - 2 VIEW COMPARISON:  None. FINDINGS: Knee prosthesis is noted. No soft tissue or bony abnormality is seen. IMPRESSION: Status post right knee replacement. Electronically Signed   By: Inez Catalina M.D.   On: 02/01/2019 19:25   [place] Or Nerve Block-image Only ([facility])  Result Date: 02/01/2019 There is no interpretation for this exam.  This order is for images obtained during a surgical procedure.  Please See "Surgeries" Tab for more information regarding the procedure.    Assessment/Plan: 2 Days Post-Op   Active Problems:   S/P TKR (total knee replacement) using cement, right   Advance diet Up with therapy  Discharge home with HHPT vs SNF, Today vs  Tomorrow   Carlynn Spry , PA-C 02/03/2019, 8:38 AM

## 2019-02-03 NOTE — Progress Notes (Signed)
Physical Therapy Treatment Patient Details Name: Denise [place] MRN: [id number] DOB: 1951/01/30 Today's Date: 02/03/2019    History of Present Illness Pt 32 female admitted s/p R TKR 02/01/19. PMH includes: connective tissue disorder, C5/C6 repair, CTR    PT Comments    PT in bed upon arrival and agreed to participate in PT. Pt reported increased pain and stiffness and having a rough night last night. Pt performed HEP with minimal cuing for correct performance and good carryover. R knee ROM -6-65 deg. Pt required min assist for bed mobility today secondary to increased pain. Pt min guard for transfers and ambulation today. Pt is progressing well. PT will progress ambulation and initiate stair training as appropriate. Home health PT remains appropriate following hospital discharge.   Follow Up Recommendations  Home health PT     Equipment Recommendations  3in1 (PT);Rolling walker with 5" wheels    Recommendations for Other Services       Precautions / Restrictions Precautions Precautions: Fall Restrictions Weight Bearing Restrictions: Yes RLE Weight Bearing: Weight bearing as tolerated    Mobility  Bed Mobility Overal bed mobility: Needs Assistance Bed Mobility: Supine to Sit     Supine to sit: HOB elevated;Min assist Sit to supine: Min assist;HOB elevated   General bed mobility comments: pt required min assist for LE advancement today due to increased pain and stiffness,  Transfers Overall transfer level: Needs assistance Equipment used: Rolling walker (2 wheeled) Transfers: Sit to/from Stand Sit to Stand: Min guard         General transfer comment: safe with transfers, no assist to rise from bed, steady with initial standing, pt utilized multiple rocks in order to gain momentum in order to stand  Ambulation/Gait Ambulation/Gait assistance: Land (Feet): 150 Feet Assistive device: Rolling walker (2 wheeled) Gait Pattern/deviations: Step-to  pattern;Decreased stance time - right;Decreased weight shift to right;Decreased stride length;Trunk flexed;Antalgic Gait velocity: decreased   General Gait Details: pt ambulated in hall with more antalgic gait pattern today, continues to rely heavily on RW, no unsteadiness, buckling or LOB   Stairs             Wheelchair Mobility    Modified Rankin (Stroke Patients Only)       Balance Overall balance assessment: Needs assistance Sitting-balance support: Feet supported Sitting balance-Leahy Scale: Good Sitting balance - Comments: steady sitting EOB with LE therex     Standing balance-Leahy Scale: Fair Standing balance comment: pt able to stand without UE support from walker, dec standing balance consistent with recent surgery and WBing status                            Cognition Arousal/Alertness: Awake/alert Behavior During Therapy: WFL for tasks assessed/performed Overall Cognitive Status: Within Functional Limits for tasks assessed                                        Exercises Total Joint Exercises Ankle Circles/Pumps: AROM;Both;10 reps Quad Sets: AROM;Both;10 reps Hip ABduction/ADduction: AROM;Both;10 reps Straight Leg Raises: AROM;Both;10 reps Long Arc Quad: AROM;Both;10 reps Knee Flexion: AROM;Both;10 reps Goniometric ROM: R knee ROM -6 ext - 65 deg flexion    General Comments        Pertinent Vitals/Pain Pain Score: 7  Pain Location: R knee Pain Descriptors / Indicators: Sore;Aching;Operative site guarding Pain Intervention(s): Limited activity  within patient's tolerance;Monitored during session;Premedicated before session;Ice applied    Home Living                      Prior Function            PT Goals (current goals can now be found in the care plan section) Progress towards PT goals: Progressing toward goals    Frequency    BID      PT Plan Current plan remains appropriate    Co-evaluation               AM-PAC PT "6 Clicks" Mobility   Outcome Measure  Help needed turning from your back to your side while in a flat bed without using bedrails?: A Little Help needed moving from lying on your back to sitting on the side of a flat bed without using bedrails?: A Little Help needed moving to and from a bed to a chair (including a wheelchair)?: A Little Help needed standing up from a chair using your arms (e.g., wheelchair or bedside chair)?: A Little Help needed to walk in hospital room?: A Little Help needed climbing 3-5 steps with a railing? : A Little 6 Click Score: 18    End of Session Equipment Utilized During Treatment: Gait belt Activity Tolerance: Patient tolerated treatment well;Patient limited by pain Patient left: in bed;with call bell/phone within reach;with SCD's reapplied;with family/visitor present Nurse Communication: Mobility status PT Visit Diagnosis: Difficulty in walking, not elsewhere classified (R26.2);Muscle weakness (generalized) (M62.81);Pain Pain - Right/Left: Right Pain - part of body: Knee     Time: 1100-1129 PT Time Calculation (min) (ACUTE ONLY): 29 min  Charges:  $Therapeutic Exercise: 8-22 mins                     Keyleen Cerrato PT, DPT 1:15 PM,02/03/19 [phone]

## 2019-02-04 NOTE — Discharge Summary (Signed)
Physician Discharge Summary  Patient ID: Denise Norris MRN: [id number] DOB/AGE: Apr 15, 1951 68 y.o.  Admit date: 02/01/2019 Discharge date: 02/04/2019  Admission Diagnoses:  M17.11 unilateral primary osteoarthritis right knee <principal problem not specified>  Discharge Diagnoses:  M17.11 unilateral primary osteoarthritis right knee Active Problems:   S/P TKR (total knee replacement) using cement, right   Past Medical History:  Diagnosis Date  . Arthritis   . Connective tissue disorder (HCC)    Unspecified, previously followed by [facility].  Marland Kitchen GERD (gastroesophageal reflux disease)   . Glaucoma   . Migraine     Surgeries: Procedure(s): TOTAL KNEE ARTHROPLASTY on 02/01/2019   Consultants (if any):   Discharged Condition: Improved  Hospital Course: Denise Norris is an 68 y.o. female who was admitted 02/01/2019 with a diagnosis of  M17.11 unilateral primary osteoarthritis right knee <principal problem not specified> and went to the operating room on 02/01/2019 and underwent the above named procedures.    She was given perioperative antibiotics:  Anti-infectives (From admission, onward)   Start     Dose/Rate Route Frequency Ordered Stop   02/01/19 2000  ceFAZolin (ANCEF) IVPB 2g/100 mL premix     2 g 200 mL/hr over 30 Minutes Intravenous Every 6 hours 02/01/19 1747 02/02/19 0316   02/01/19 1420  50,000 units bacitracin in 0.9% normal saline 250 mL irrigation  Status:  Discontinued       As needed 02/01/19 1433 02/01/19 1540   02/01/19 1229  ceFAZolin (ANCEF) 2-4 GM/100ML-% IVPB    Note to Pharmacy: Malva Limes   : cabinet override      02/01/19 1229 02/01/19 1405   02/01/19 0600  ceFAZolin (ANCEF) IVPB 2g/100 mL premix     2 g 200 mL/hr over 30 Minutes Intravenous On call to O.R. 01/31/19 2306 02/01/19 1410    .  She was given sequential compression devices, early ambulation, and Xarelto for DVT prophylaxis.  She benefited maximally from the hospital stay and there  were no complications.    Recent vital signs:  Vitals:   02/03/19 2351 02/04/19 0800  BP: 118/65 113/65  Pulse: 86 74  Resp: 15 16  Temp: 99.2 F (37.3 C) 98.5 F (36.9 C)  SpO2: 95% 100%    Recent laboratory studies:  Lab Results  Component Value Date   HGB 9.4 (L) 02/03/2019   HGB 10.6 (L) 02/02/2019   HGB 12.4 01/04/2019   Lab Results  Component Value Date   WBC 6.0 02/03/2019   PLT 189 02/03/2019   Lab Results  Component Value Date   INR 1.0 01/04/2019   Lab Results  Component Value Date   NA 140 02/02/2019   K 3.8 02/02/2019   CL 106 02/02/2019   CO2 26 02/02/2019   BUN 18 02/02/2019   CREATININE 0.58 02/02/2019   GLUCOSE 123 (H) 02/02/2019    Discharge Medications:   Allergies as of 02/04/2019      Reactions   Codeine Hives      Medication List    TAKE these medications   acetaminophen 650 MG CR tablet Commonly known as: TYLENOL Take 1,300 mg by mouth at bedtime.   b complex vitamins tablet Take 1 tablet by mouth daily.   cholecalciferol 25 MCG (1000 UT) tablet Commonly known as: VITAMIN D3 Take 1,000 Units by mouth daily.   docusate sodium 100 MG capsule Commonly known as: COLACE Take 1 capsule (100 mg total) by mouth 2 (two) times daily.   famotidine 10 MG tablet  Commonly known as: PEPCID Take 10 mg by mouth daily as needed for heartburn or indigestion.   HYDROcodone-acetaminophen 7.5-325 MG tablet Commonly known as: NORCO Take 1 tablet by mouth every 4 (four) hours as needed for severe pain (pain score 7-10).   methocarbamol 500 MG tablet Commonly known as: ROBAXIN Take 1 tablet (500 mg total) by mouth every 6 (six) hours as needed for muscle spasms.   rivaroxaban 10 MG Tabs tablet Commonly known as: XARELTO Take 1 tablet (10 mg total) by mouth daily with breakfast.   timolol 0.5 % ophthalmic solution Commonly known as: BETIMOL Place 1 drop into both eyes 2 (two) times daily.   Turmeric Curcumin Caps Take 2 capsules by  mouth daily.   vitamin E 400 UNIT capsule Take 400 Units by mouth daily.            Durable Medical Equipment  (From admission, onward)         Start     Ordered   02/03/19 1140  For home use only DME Walker rolling  Once    Question:  Patient needs a walker to treat with the following condition  Answer:  Weakness   02/03/19 1139   02/03/19 0942  For home use only DME 4 wheeled rolling walker with seat  Once    Question:  Patient needs a walker to treat with the following condition  Answer:  Weakness   02/03/19 0942   02/03/19 0942  For home use only DME Bedside commode  Once    Question:  Patient needs a bedside commode to treat with the following condition  Answer:  Weakness   02/03/19 0942          Diagnostic Studies: Dg Knee Right Port  Result Date: 02/01/2019 CLINICAL DATA:  Right knee replacement EXAM: PORTABLE RIGHT KNEE - 2 VIEW COMPARISON:  None. FINDINGS: Knee prosthesis is noted. No soft tissue or bony abnormality is seen. IMPRESSION: Status post right knee replacement. Electronically Signed   By: Inez Catalina M.D.   On: 02/01/2019 19:25   [place] Or Nerve Block-image Only ([facility])  Result Date: 02/01/2019 There is no interpretation for this exam.  This order is for images obtained during a surgical procedure.  Please See "Surgeries" Tab for more information regarding the procedure.    Disposition: Discharge disposition: 01-Home or Self Care            Signed: Carlynn Spry ,PA-C 02/04/2019, 10:34 AM

## 2019-02-04 NOTE — TOC Transition Note (Addendum)
Transition of Care [facility]) - CM/SW Discharge Note   Patient Details  Name: Denise Norris MRN: [id number] Date of Birth: Mar 19, 1951  [facility] CM/SW Contact:  Marshell Garfinkel, RN Phone Number: 02/04/2019, 1:33 PM   Clinical Narrative:     Message left for [place] with [facility] home health. Patient discharged before I was able to see her. Per Sherlene Shams, patient had received bedside commode and walker to take home. Message also left for son to call RNCM with any concerns or questions. Update: Son returned my call and states "they have all equipment/everything they need".   Final next level of care: [place] Barriers to Discharge: No Barriers Identified(patient d/cd before i got to speak with her.)   Patient Goals and CMS Choice Patient states their goals for this hospitalization and ongoing recovery are:: go home with son      Discharge Placement                       Discharge Plan and Services   Discharge Planning Services: CM Consult            DME Arranged: 3-N-1, Walker rolling DME Agency: [employer] Date DME Agency Contacted: 02/03/19 Time DME Agency Contacted: [phone] Representative spoke with at DME Agency: [place]: PT [place]: [facility] [place] Date [place]: 02/04/19 Time [place]: 1332 Representative spoke with at [place]: message left for [place] Determinants of Health (SDOH) Interventions     Readmission Risk Interventions No flowsheet data found.

## 2019-02-04 NOTE — Discharge Instructions (Signed)
Continue weight bear as tolerated on the right lower extremity.    Elevate the right lower extremity whenever possible and continue the polar care while elevating the extremity. Patient may shower. No bath or submerging the wound.    Take Xarelto as directed for blood clot prevention.  Continue to work on knee range of motion exercises at home as instructed by physical therapy. Continue to use a walker for assistance with ambulation until cleared by physical therapy.  Call [phone] with any questions, such as fever > 101.5 degrees, drainage from the wound or shortness of breath.  

## 2019-02-04 NOTE — Progress Notes (Signed)
  Subjective:  Patient reports pain as mild.  Patient did well with PT.  Objective:   VITALS:   Vitals:   02/03/19 0834 02/03/19 1635 02/03/19 2351 02/04/19 0800  BP: 140/68 136/69 118/65 113/65  Pulse: 69 77 86 74  Resp: 17 18 15 16   Temp: 99.1 F (37.3 C) 99.3 F (37.4 C) 99.2 F (37.3 C) 98.5 F (36.9 C)  TempSrc: Oral Oral Oral Oral  SpO2: 97% 99% 95% 100%  Weight:      Height:        PHYSICAL EXAM:  Neurologically intact ABD soft Neurovascular intact Sensation intact distally Intact pulses distally Dorsiflexion/Plantar flexion intact Incision: dressing C/D/I No cellulitis present Compartment soft  LABS  No results found for this or any previous visit (from the past 24 hour(s)).  No results found.  Assessment/Plan: 3 Days Post-Op   Active Problems:   S/P TKR (total knee replacement) using cement, right   Advance diet Up with therapy  Discharge home this am   Carlynn Spry , PA-C 02/04/2019, 10:32 AM

## 2019-02-04 NOTE — Progress Notes (Signed)
Physical Therapy Treatment Patient Details Name: Denise Norris MRN: [id number] DOB: 26-Nov-1950 Today's Date: 02/04/2019    History of Present Illness Pt 36 female admitted s/p R TKR 02/01/19. PMH includes: connective tissue disorder, C5/C6 repair, CTR    PT Comments    Participated in exercises as described below.  Ambulated to/from rehab gym with walker and min guard/assist +1.  At times steps too far into walker with cues to correct. Stair training complete.  No further questions or concerns.  Encouraged her to have +1 assist with mobility initially upon discharge for safety.  Voiced understanding.   Follow Up Recommendations  Home health PT     Equipment Recommendations  3in1 (PT);Rolling walker with 5" wheels    Recommendations for Other Services       Precautions / Restrictions Precautions Precautions: Fall Restrictions Weight Bearing Restrictions: Yes RLE Weight Bearing: Weight bearing as tolerated    Mobility  Bed Mobility Overal bed mobility: Needs Assistance       Supine to sit: Min assist;HOB elevated     General bed mobility comments: min assist for LE advancement secondary to increased pain, pt utilized increased time and effort to get EOB  Transfers Overall transfer level: Needs assistance Equipment used: Rolling walker (2 wheeled) Transfers: Sit to/from Stand Sit to Stand: Min guard         General transfer comment: verbal cues for hand placements  Ambulation/Gait Ambulation/Gait assistance: Min guard Gait Distance (Feet): 200 Feet Assistive device: Rolling walker (2 wheeled) Gait Pattern/deviations: Step-to pattern;Decreased stance time - right;Decreased weight shift to right;Decreased stride length;Trunk flexed;Antalgic Gait velocity: decreased   General Gait Details: verbal cues to not step too far into walker   Stairs Stairs: Yes Stairs assistance: Min guard Stair Management: One rail Right;Step to pattern Number of Stairs: 4 General  stair comments: with ease   Wheelchair Mobility    Modified Rankin (Stroke Patients Only)       Balance Overall balance assessment: Needs assistance Sitting-balance support: Feet supported Sitting balance-Leahy Scale: Good Sitting balance - Comments: steady sitting EOB with LE therex     Standing balance-Leahy Scale: Good                              Cognition Arousal/Alertness: Awake/alert Behavior During Therapy: WFL for tasks assessed/performed Overall Cognitive Status: Within Functional Limits for tasks assessed                                        Exercises Total Joint Exercises Ankle Circles/Pumps: AROM;Both;10 reps Quad Sets: AROM;10 reps;Right Hip ABduction/ADduction: AROM;Right;10 reps Straight Leg Raises: AROM;Right;10 reps Long Arc Quad: AROM;Right;10 reps Knee Flexion: AROM;Right;10 reps    General Comments        Pertinent Vitals/Pain Pain Assessment: 0-10 Pain Score: 5  Pain Location: R knee Pain Descriptors / Indicators: Sore;Aching;Operative site guarding Pain Intervention(s): Monitored during session;Repositioned;Limited activity within patient's tolerance    Home Living                      Prior Function            PT Goals (current goals can now be found in the care plan section) Progress towards PT goals: Progressing toward goals    Frequency    BID      PT Plan Current  plan remains appropriate    Co-evaluation              AM-PAC PT "6 Clicks" Mobility   Outcome Measure  Help needed turning from your back to your side while in a flat bed without using bedrails?: A Little Help needed moving from lying on your back to sitting on the side of a flat bed without using bedrails?: A Little Help needed moving to and from a bed to a chair (including a wheelchair)?: A Little Help needed standing up from a chair using your arms (e.g., wheelchair or bedside chair)?: A Little Help needed  to walk in hospital room?: A Little Help needed climbing 3-5 steps with a railing? : A Little 6 Click Score: 18    End of Session Equipment Utilized During Treatment: Gait belt Activity Tolerance: Patient limited by pain Patient left: in chair;with call bell/phone within reach;with SCD's reapplied;with nursing/sitter in room Nurse Communication: Mobility status Pain - Right/Left: Right Pain - part of body: Knee     Time: 0951-1020 PT Time Calculation (min) (ACUTE ONLY): 29 min  Charges:  $Gait Training: 8-22 mins $Therapeutic Exercise: 8-22 mins                    Danielle Dess, PTA 02/04/19, 2:00 PM

## 2019-02-04 NOTE — Progress Notes (Signed)
DISCHARGE NOTE:  Pt given discharge instructions. Pt verbalized understanding. surgical dressing clean, dry and intact. Pt wheeled to car by staff, BSC and walker sent with pt.  Son providing transportation.

## 2019-02-04 NOTE — Progress Notes (Signed)
Per Linton Rump, [place] pt ok to d/c without having bowel movement. Pt educated on bowel regimen. Pt verbalized understanding.

## 2019-02-06 ENCOUNTER — Telehealth: Payer: Self-pay | Admitting: *Deleted

## 2019-02-06 NOTE — Telephone Encounter (Signed)
Pt was on TCM report admitted 02/01/19 for unilateral primary osteoarthritis right knee. Pt underwent a TOTAL (R) KNEE ARTHROPLASTY. Pt tolerated procedure and was d/c 02/04/19. Pt will follow-up w/ surgeon in 2/ wks.Marland KitchenJohny Chess

## 2019-02-08 DIAGNOSIS — M25661 Stiffness of right knee, not elsewhere classified: Secondary | ICD-10-CM | POA: Diagnosis not present

## 2019-02-08 DIAGNOSIS — M25561 Pain in right knee: Secondary | ICD-10-CM | POA: Diagnosis not present

## 2019-02-10 DIAGNOSIS — M25661 Stiffness of right knee, not elsewhere classified: Secondary | ICD-10-CM | POA: Diagnosis not present

## 2019-02-10 DIAGNOSIS — M25561 Pain in right knee: Secondary | ICD-10-CM | POA: Diagnosis not present

## 2019-02-15 DIAGNOSIS — M25561 Pain in right knee: Secondary | ICD-10-CM | POA: Diagnosis not present

## 2019-02-15 DIAGNOSIS — M25661 Stiffness of right knee, not elsewhere classified: Secondary | ICD-10-CM | POA: Diagnosis not present

## 2019-02-17 DIAGNOSIS — M25661 Stiffness of right knee, not elsewhere classified: Secondary | ICD-10-CM | POA: Diagnosis not present

## 2019-02-17 DIAGNOSIS — M25561 Pain in right knee: Secondary | ICD-10-CM | POA: Diagnosis not present

## 2019-02-22 DIAGNOSIS — M25661 Stiffness of right knee, not elsewhere classified: Secondary | ICD-10-CM | POA: Diagnosis not present

## 2019-02-22 DIAGNOSIS — M25561 Pain in right knee: Secondary | ICD-10-CM | POA: Diagnosis not present

## 2019-02-24 DIAGNOSIS — M25661 Stiffness of right knee, not elsewhere classified: Secondary | ICD-10-CM | POA: Diagnosis not present

## 2019-02-24 DIAGNOSIS — M25561 Pain in right knee: Secondary | ICD-10-CM | POA: Diagnosis not present

## 2019-02-27 DIAGNOSIS — M25661 Stiffness of right knee, not elsewhere classified: Secondary | ICD-10-CM | POA: Diagnosis not present

## 2019-02-27 DIAGNOSIS — M25561 Pain in right knee: Secondary | ICD-10-CM | POA: Diagnosis not present

## 2019-02-27 NOTE — H&P (Signed)
PREOPERATIVE H&P  Chief Complaint: M17.11 unilateral primary osteoarthritis right knee  HPI: Denise Norris is a 68 y.o. female who presents for preoperative history and physical with a diagnosis of M17.11 unilateral primary osteoarthritis right knee. Symptoms are rated as moderate to severe, and have been worsening.  This is significantly impairing activities of daily living.  She has elected for surgical management.   Past Medical History:  Diagnosis Date  . Arthritis   . Connective tissue disorder ([place])    Unspecified, previously followed by [place] [facility].  Marland Kitchen GERD (gastroesophageal reflux disease)   . Glaucoma   . Migraine    Past Surgical History:  Procedure Laterality Date  . CARPAL TUNNEL RELEASE Bilateral   . CATARACT EXTRACTION Bilateral   . CERVICAL SPINE SURGERY     C5/C6 repair.  Previous right arm radiculopathy resolved with surgery.  Marland Kitchen TOTAL KNEE ARTHROPLASTY Right 02/01/2019   Procedure: TOTAL KNEE ARTHROPLASTY;  Surgeon: Lovell Sheehan, MD;  Location: [facility] ORS;  Service: Orthopedics;  Laterality: Right;   Social History   Socioeconomic History  . Marital status: Widowed    Spouse name: Not on file  . Number of children: Not on file  . Years of education: Not on file  . Highest education level: Not on file  Occupational History  . Not on file  Social Needs  . Financial resource strain: Not on file  . Food insecurity    Worry: Not on file    Inability: Not on file  . Transportation needs    Medical: Not on file    Non-medical: Not on file  Tobacco Use  . Smoking status: Never Smoker  . Smokeless tobacco: Never Used  Substance and Sexual Activity  . Alcohol use: Never    Frequency: Never  . Drug use: Never  . Sexual activity: Not on file  Lifestyle  . Physical activity    Days per week: Not on file    Minutes per session: Not on file  . Stress: Not on file  Relationships  . Social Herbalist on phone: Not on file    Gets together: Not on  file    Attends religious service: Not on file    Active member of club or organization: Not on file    Attends meetings of clubs or organizations: Not on file    Relationship status: Not on file  Other Topics Concern  . Not on file  Social History Narrative   Married 1972.   Undergraduate degree at [employer] in [place].   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  Previously taught fourth through sixth grade      As of 2019 she and her husband split time at their house and their son's house.   Family History  Problem Relation Age of Onset  . Breast cancer Sister   . Colon cancer Neg Hx    Allergies  Allergen Reactions  . Codeine Hives   Prior to Admission medications   Medication Sig Start Date End Date Taking? Authorizing Provider  acetaminophen (TYLENOL) 650 MG CR tablet Take 1,300 mg by mouth at bedtime.   Yes [provider]  cholecalciferol (VITAMIN D3) 25 MCG (1000 UT) tablet Take 1,000 Units by mouth daily.   Yes [provider]  famotidine (PEPCID) 10 MG tablet Take 10 mg by mouth daily as needed for heartburn or  indigestion.   Yes [provider]  timolol (BETIMOL) 0.5 % ophthalmic solution Place 1 drop into both eyes 2 (two) times daily.    Yes [provider]  vitamin E 400 UNIT capsule Take 400 Units by mouth daily.   Yes [provider]  b complex vitamins tablet Take 1 tablet by mouth daily.    [provider]  docusate sodium (COLACE) 100 MG capsule Take 1 capsule (100 mg total) by mouth 2 (two) times daily. 02/03/19   Altamese Cabal, PA-C  HYDROcodone-acetaminophen (NORCO) 7.5-325 MG tablet Take 1 tablet by mouth every 4 (four) hours as needed for severe pain (pain score 7-10). 02/03/19   Altamese Cabal, PA-C  methocarbamol (ROBAXIN) 500 MG tablet Take 1 tablet (500 mg total) by mouth every 6 (six) hours as needed for muscle spasms.  02/03/19   Altamese Cabal, PA-C  Misc Natural Products (TURMERIC CURCUMIN) CAPS Take 2 capsules by mouth daily.     [provider]  rivaroxaban (XARELTO) 10 MG TABS tablet Take 1 tablet (10 mg total) by mouth daily with breakfast. 02/04/19   Altamese Cabal, PA-C     Positive ROS: All other systems have been reviewed and were otherwise negative with the exception of those mentioned in the HPI and as above.  Physical Exam: General: Alert, no acute distress Cardiovascular: Regular rate and rhythm, no murmurs rubs or gallops.  No pedal edema Respiratory: Clear to auscultation bilaterally, no wheezes rales or rhonchi. No cyanosis, no use of accessory musculature GI: No organomegaly, abdomen is soft and non-tender nondistended with positive bowel sounds. Skin: Skin intact, no lesions within the operative field. Neurologic: Sensation intact distally Psychiatric: Patient is competent for consent with normal mood and affect Lymphatic: No axillary or cervical lymphadenopathy  MUSCULOSKELETAL: right knee joint line tenderness, effusion, motor and sensory intact  Assessment: M17.11 unilateral primary osteoarthritis right knee  Plan: Plan for Procedure(s): TOTAL KNEE ARTHROPLASTY, RIGHT  I discussed the risks and benefits of surgery. The risks include but are not limited to infection, bleeding requiring blood transfusion, nerve or blood vessel injury, joint stiffness or loss of motion, persistent pain, weakness or instability, malunion, nonunion and hardware failure and the need for further surgery. Medical risks include but are not limited to DVT and pulmonary embolism, myocardial infarction, stroke, pneumonia, respiratory failure and death. Patient understood these risks and wished to proceed.   [name], MD   02/27/2019 9:33 AM

## 2019-03-01 DIAGNOSIS — M25661 Stiffness of right knee, not elsewhere classified: Secondary | ICD-10-CM | POA: Diagnosis not present

## 2019-03-01 DIAGNOSIS — M25561 Pain in right knee: Secondary | ICD-10-CM | POA: Diagnosis not present

## 2019-03-08 DIAGNOSIS — M25561 Pain in right knee: Secondary | ICD-10-CM | POA: Diagnosis not present

## 2019-03-08 DIAGNOSIS — M25661 Stiffness of right knee, not elsewhere classified: Secondary | ICD-10-CM | POA: Diagnosis not present

## 2019-03-10 DIAGNOSIS — M25561 Pain in right knee: Secondary | ICD-10-CM | POA: Diagnosis not present

## 2019-03-10 DIAGNOSIS — M25661 Stiffness of right knee, not elsewhere classified: Secondary | ICD-10-CM | POA: Diagnosis not present

## 2019-03-14 DIAGNOSIS — M25661 Stiffness of right knee, not elsewhere classified: Secondary | ICD-10-CM | POA: Diagnosis not present

## 2019-03-14 DIAGNOSIS — Z96651 Presence of right artificial knee joint: Secondary | ICD-10-CM | POA: Diagnosis not present

## 2019-03-14 DIAGNOSIS — M25561 Pain in right knee: Secondary | ICD-10-CM | POA: Diagnosis not present

## 2019-03-17 DIAGNOSIS — M25661 Stiffness of right knee, not elsewhere classified: Secondary | ICD-10-CM | POA: Diagnosis not present

## 2019-03-17 DIAGNOSIS — M25561 Pain in right knee: Secondary | ICD-10-CM | POA: Diagnosis not present

## 2019-03-20 DIAGNOSIS — M25661 Stiffness of right knee, not elsewhere classified: Secondary | ICD-10-CM | POA: Diagnosis not present

## 2019-03-20 DIAGNOSIS — M25561 Pain in right knee: Secondary | ICD-10-CM | POA: Diagnosis not present

## 2019-03-22 DIAGNOSIS — M25661 Stiffness of right knee, not elsewhere classified: Secondary | ICD-10-CM | POA: Diagnosis not present

## 2019-03-22 DIAGNOSIS — M25561 Pain in right knee: Secondary | ICD-10-CM | POA: Diagnosis not present

## 2019-03-27 DIAGNOSIS — M25561 Pain in right knee: Secondary | ICD-10-CM | POA: Diagnosis not present

## 2019-03-27 DIAGNOSIS — M25661 Stiffness of right knee, not elsewhere classified: Secondary | ICD-10-CM | POA: Diagnosis not present

## 2019-03-29 DIAGNOSIS — M25661 Stiffness of right knee, not elsewhere classified: Secondary | ICD-10-CM | POA: Diagnosis not present

## 2019-03-29 DIAGNOSIS — M25561 Pain in right knee: Secondary | ICD-10-CM | POA: Diagnosis not present

## 2019-04-03 DIAGNOSIS — M25661 Stiffness of right knee, not elsewhere classified: Secondary | ICD-10-CM | POA: Diagnosis not present

## 2019-04-03 DIAGNOSIS — M25561 Pain in right knee: Secondary | ICD-10-CM | POA: Diagnosis not present

## 2019-04-05 DIAGNOSIS — M25661 Stiffness of right knee, not elsewhere classified: Secondary | ICD-10-CM | POA: Diagnosis not present

## 2019-04-05 DIAGNOSIS — M25561 Pain in right knee: Secondary | ICD-10-CM | POA: Diagnosis not present

## 2019-04-12 DIAGNOSIS — M25661 Stiffness of right knee, not elsewhere classified: Secondary | ICD-10-CM | POA: Diagnosis not present

## 2019-04-12 DIAGNOSIS — M25561 Pain in right knee: Secondary | ICD-10-CM | POA: Diagnosis not present

## 2019-04-17 DIAGNOSIS — M25661 Stiffness of right knee, not elsewhere classified: Secondary | ICD-10-CM | POA: Diagnosis not present

## 2019-04-17 DIAGNOSIS — M25561 Pain in right knee: Secondary | ICD-10-CM | POA: Diagnosis not present

## 2019-04-26 DIAGNOSIS — M25661 Stiffness of right knee, not elsewhere classified: Secondary | ICD-10-CM | POA: Diagnosis not present

## 2019-04-26 DIAGNOSIS — M25561 Pain in right knee: Secondary | ICD-10-CM | POA: Diagnosis not present

## 2019-05-15 DIAGNOSIS — Z96651 Presence of right artificial knee joint: Secondary | ICD-10-CM | POA: Diagnosis not present

## 2019-05-15 DIAGNOSIS — M25511 Pain in right shoulder: Secondary | ICD-10-CM | POA: Diagnosis not present

## 2019-05-15 DIAGNOSIS — M25512 Pain in left shoulder: Secondary | ICD-10-CM | POA: Diagnosis not present

## 2019-05-15 DIAGNOSIS — M1712 Unilateral primary osteoarthritis, left knee: Secondary | ICD-10-CM | POA: Diagnosis not present

## 2019-05-23 DIAGNOSIS — Z96651 Presence of right artificial knee joint: Secondary | ICD-10-CM | POA: Diagnosis not present

## 2019-05-23 DIAGNOSIS — M25511 Pain in right shoulder: Secondary | ICD-10-CM | POA: Diagnosis not present

## 2019-05-23 DIAGNOSIS — M25512 Pain in left shoulder: Secondary | ICD-10-CM | POA: Diagnosis not present

## 2019-06-12 DIAGNOSIS — Z9189 Other specified personal risk factors, not elsewhere classified: Secondary | ICD-10-CM | POA: Diagnosis not present

## 2019-06-12 DIAGNOSIS — Z1231 Encounter for screening mammogram for malignant neoplasm of breast: Secondary | ICD-10-CM | POA: Diagnosis not present

## 2019-06-12 DIAGNOSIS — Z9289 Personal history of other medical treatment: Secondary | ICD-10-CM | POA: Diagnosis not present

## 2019-06-12 DIAGNOSIS — Z1212 Encounter for screening for malignant neoplasm of rectum: Secondary | ICD-10-CM | POA: Diagnosis not present

## 2019-06-12 DIAGNOSIS — Z124 Encounter for screening for malignant neoplasm of cervix: Secondary | ICD-10-CM | POA: Diagnosis not present

## 2019-07-04 DIAGNOSIS — M25511 Pain in right shoulder: Secondary | ICD-10-CM | POA: Diagnosis not present

## 2019-07-04 DIAGNOSIS — M1712 Unilateral primary osteoarthritis, left knee: Secondary | ICD-10-CM | POA: Diagnosis not present

## 2019-07-04 DIAGNOSIS — M25512 Pain in left shoulder: Secondary | ICD-10-CM | POA: Diagnosis not present

## 2019-07-04 DIAGNOSIS — Z96651 Presence of right artificial knee joint: Secondary | ICD-10-CM | POA: Diagnosis not present

## 2019-07-21 ENCOUNTER — Telehealth: Payer: Self-pay

## 2019-07-21 NOTE — Telephone Encounter (Signed)
Left message for patient to call back to schedule pre op exam. Clearance form is on lugene's desk.

## 2019-07-24 NOTE — Telephone Encounter (Signed)
Pt would like an office visit to discuss pre-op. Scheduled pt for 5/18. Gave pre-op paperwork completed by Dr. Algis Downs to Laurette Schimke, CMA.

## 2019-08-14 ENCOUNTER — Telehealth: Payer: Self-pay | Admitting: Family Medicine

## 2019-08-14 NOTE — Telephone Encounter (Signed)
Called and got pateint scheduled for CPE and labs.

## 2019-08-17 ENCOUNTER — Ambulatory Visit (INDEPENDENT_AMBULATORY_CARE_PROVIDER_SITE_OTHER): Payer: Medicare Other

## 2019-08-17 VITALS — BP 131/85 | Wt 164.0 lb

## 2019-08-17 DIAGNOSIS — Z0000 Encounter for general adult medical examination without abnormal findings: Secondary | ICD-10-CM

## 2019-08-17 NOTE — Progress Notes (Signed)
Subjective:   Denise Norris is a 69 y.o. female who presents for an Initial Medicare Annual Wellness Visit.  Review of Systems: N/A     This visit is being conducted through telemedicine via telephone at the nurse health advisor's home address due to the COVID-19 pandemic. This patient has given me verbal consent via doximity to conduct this visit, patient states they are participating from their home address. Patient and myself are on the telephone call. There is no referral for this visit. Some vital signs may be absent or patient reported.    Patient identification: identified by name, DOB, and current address    Cardiac Risk Factors include: advanced age (>42men, >72 women)     Objective:    Today's Vitals   08/17/19 1527 08/17/19 1535  BP: 131/85   Weight: 164 lb (74.4 kg)   PainSc:  3    Body mass index is 30 kg/m.  Advanced Directives 08/17/2019 02/01/2019 02/01/2019 01/04/2019  Does Patient Have a Medical Advance Directive? Yes Yes Yes Yes  Type of Estate agent of [place];Living will Healthcare Power of [employer] Power of [employer] Power of [place];Living will  Does patient want to make changes to medical advance directive? - No - Patient declined - -  Copy of Healthcare Power of Attorney in Chart? No - copy requested No - copy requested - -    Current Medications (verified) Outpatient Encounter Medications as of 08/17/2019  Medication Sig  . acetaminophen (TYLENOL) 650 MG CR tablet Take 1,300 mg by mouth at bedtime.  Marland Kitchen b complex vitamins tablet Take 1 tablet by mouth daily.  . cholecalciferol (VITAMIN D3) 25 MCG (1000 UT) tablet Take 1,000 Units by mouth daily.  Marland Kitchen docusate sodium (COLACE) 100 MG capsule Take 1 capsule (100 mg total) by mouth 2 (two) times daily.  . famotidine (PEPCID) 10 MG tablet Take 10 mg by mouth daily as needed for heartburn or indigestion.  Marland Kitchen HYDROcodone-acetaminophen (NORCO) 7.5-325 MG tablet Take 1  tablet by mouth every 4 (four) hours as needed for severe pain (pain score 7-10).  . methocarbamol (ROBAXIN) 500 MG tablet Take 1 tablet (500 mg total) by mouth every 6 (six) hours as needed for muscle spasms.  . Misc Natural Products (TURMERIC CURCUMIN) CAPS Take 2 capsules by mouth daily.   . rivaroxaban (XARELTO) 10 MG TABS tablet Take 1 tablet (10 mg total) by mouth daily with breakfast.  . timolol (BETIMOL) 0.5 % ophthalmic solution Place 1 drop into both eyes 2 (two) times daily.   . vitamin E 400 UNIT capsule Take 400 Units by mouth daily.   No facility-administered encounter medications on file as of 08/17/2019.    Allergies (verified) Codeine   History: Past Medical History:  Diagnosis Date  . Arthritis   . Connective tissue disorder (HCC)    Unspecified, previously followed by [facility].  Marland Kitchen GERD (gastroesophageal reflux disease)   . Glaucoma   . Migraine    Past Surgical History:  Procedure Laterality Date  . CARPAL TUNNEL RELEASE Bilateral   . CATARACT EXTRACTION Bilateral   . CERVICAL SPINE SURGERY     C5/C6 repair.  Previous right arm radiculopathy resolved with surgery.  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 Breast cancer Sister   . Colon  cancer Neg Hx    Social History   Socioeconomic History  . Marital status: Widowed    Spouse name: Not on file  . Number of children: Not on file  . Years of education: Not on file  . Highest education level: Not on file  Occupational History  . Not on file  Tobacco Use  . Smoking status: Never Smoker  . Smokeless tobacco: Never Used  Substance and Sexual Activity  . Alcohol use: Never  . Drug use: Never  . Sexual activity: Not on file  Other Topics Concern  . Not on file  Social History Narrative   Married 1972.    Undergraduate degree at [employer] in [place].   Masters degree at [employer]      Husband with significant memory loss      Retired Nature conservation officer.  Previously taught fourth through sixth grade      As of 2019 she and her husband split time at their house and their son's house.   Social Determinants of Health   Financial Resource Strain: Low Risk   . Difficulty of Paying Living Expenses: Not hard at all  Food Insecurity: No Food Insecurity  . Worried About Programme researcher, broadcasting/film/video in the Last Year: Never true  . Ran Out of Food in the Last Year: Never true  Transportation Needs: No Transportation Needs  . Lack of Transportation (Medical): No  . Lack of Transportation (Non-Medical): No  Physical Activity: Sufficiently Active  . Days of Exercise per Week: 5 days  . Minutes of Exercise per Session: 30 min  Stress: No Stress Concern Present  . Feeling of Stress : Not at all  Social Connections:   . Frequency of Communication with Friends and Family:   . Frequency of Social Gatherings with Friends and Family:   . Attends Religious Services:   . Active Member of Clubs or Organizations:   . Attends Banker Meetings:   Marland Kitchen Marital Status:     Tobacco Counseling Counseling given: Not Answered   Clinical Intake:  Pre-visit preparation completed: Yes  Pain : 0-10 Pain Score: 3  Pain Type: Chronic pain Pain Location: Knee(left) Pain Descriptors / Indicators: Aching Pain Onset: More than a month ago Pain Frequency: Intermittent     Nutritional Risks: None Diabetes: No  How often do you need to have someone help you when you read instructions, pamphlets, or other written materials from your doctor or pharmacy?: 1 - Never What is the last grade level you completed in school?: masters     Information entered by :: cJohnson, LPN   Activities of Daily Living In your present state of health, do you have any difficulty performing the following  activities: 08/17/2019 02/01/2019  Hearing? N N  Vision? N N  Difficulty concentrating or making decisions? N N  Walking or climbing stairs? N Y  Dressing or bathing? N N  Doing errands, shopping? N N  Preparing Food and eating ? N -  Using the Toilet? N -  In the past six months, have you accidently leaked urine? N -  Do you have problems with loss of bowel control? N -  Managing your Medications? N -  Managing your Finances? N -  Housekeeping or managing your Housekeeping? N -  Some recent data might be hidden     Immunizations and Health Maintenance  There is no immunization history on file for this patient. Health Maintenance Due  Topic Date Due  .  COVID-19 Vaccine (1) Never done    Patient Care Team: Tonia Ghent, MD as PCP - General (Family Medicine)  Indicate any recent Medical Services you may have received from other than [facility] providers in the past year (date may be approximate).     Assessment:   This is a routine wellness examination for Denise Norris.  Hearing/Vision screen  Hearing Screening   125Hz  250Hz  500Hz  1000Hz  2000Hz  3000Hz  4000Hz  6000Hz  8000Hz   Right ear:           Left ear:           Vision Screening Comments: Patient gets biannual eye exams due to glaucoma   Dietary issues and exercise activities discussed: Current Exercise Habits: Home exercise routine, Type of exercise: walking, Time (Minutes): 30, Frequency (Times/Week): 5, Weekly Exercise (Minutes/Week): 150, Intensity: Moderate, Exercise limited by: None identified  Goals    . Patient Stated     08/17/2019, I will continue to walk 4-5 days a week for 30 minutes      Depression Screen PHQ 2/9 Scores 08/17/2019 03/10/2018  PHQ - 2 Score 0 0  PHQ- 9 Score 0 -    Fall Risk Fall Risk  08/17/2019  Falls in the past year? 1  Comment has knee pain  Number falls in past yr: 0  Injury with Fall? 0  Risk for fall due to : Impaired mobility  Follow up Falls evaluation completed;Falls prevention  discussed    Is the patient's home free of loose throw rugs in walkways, pet beds, electrical cords, etc?   yes      Grab bars in the bathroom? yes      Handrails on the stairs?   yes      Adequate lighting?   yes  Timed Get Up and Go Performed: N/A  Cognitive Function: MMSE - Mini Mental State Exam 08/17/2019  Orientation to time 5  Orientation to Place 5  Registration 3  Attention/ Calculation 5  Recall 3  Language- repeat 1       Mini Cog  Mini-Cog screen was completed. Maximum score is 22. A value of 0 denotes this part of the MMSE was not completed or the patient failed this part of the Mini-Cog screening.  Screening Tests Health Maintenance  Topic Date Due  . COVID-19 Vaccine (1) Never done  . PNA vac Low Risk Adult (1 of 2 - PCV13) 08/16/2020 (Originally 07/22/2015)  . DEXA SCAN  08/17/2023 (Originally 07/22/2015)  . COLONOSCOPY  08/17/2023 (Originally 07/21/2000)  . TETANUS/TDAP  08/17/2023 (Originally 07/21/1969)  . INFLUENZA VACCINE  11/26/2019  . MAMMOGRAM  05/28/2021  . Hepatitis C Screening  Completed    Qualifies for Shingles Vaccine: yes  Cancer Screenings: Lung: Low Dose CT Chest recommended if Age 17-80 years, 30 pack-year currently smoking OR have quit w/in 15 years. Patient does not qualify. Breast: Up to date on Mammogram: Yes, completed 05/29/2019 per patient  Up to date of Bone Density/Dexa: No, declined Colorectal: declined  Additional Screenings:  Hepatitis C Screening: 06/06/2017     Plan:    Patient will continue to walk 4-5 days a week for 30 minutes.  I have personally reviewed and noted the following in the patient's chart:   . Medical and social history . Use of alcohol, tobacco or illicit drugs  . Current medications and supplements . Functional ability and status . Nutritional status . Physical activity . Advanced directives . List of other physicians . Hospitalizations, surgeries, and ER visits  in previous 12  months . Vitals . Screenings to include cognitive, depression, and falls . Referrals and appointments  In addition, I have reviewed and discussed with patient certain preventive protocols, quality metrics, and best practice recommendations. A written personalized care plan for preventive services as well as general preventive health recommendations were provided to patient.     Janalyn Shy, LPN   12/23/9369

## 2019-08-17 NOTE — Patient Instructions (Signed)
Denise Norris , Thank you for taking time to come for your Medicare Wellness Visit. I appreciate your ongoing commitment to your health goals. Please review the following plan we discussed and let me know if I can assist you in the future.   Screening recommendations/referrals: Colonoscopy: declined Mammogram: Up to date, completed 05/29/2019 Bone Density: declined Recommended yearly ophthalmology/optometry visit for glaucoma screening and checkup Recommended yearly dental visit for hygiene and checkup  Vaccinations: Influenza vaccine: Fall 2021 Pneumococcal vaccine: declined Tdap vaccine: decline Shingles vaccine: discussed    Advanced directives: Please bring a copy of your POA (Power of Attorney) and/or Living Will to your next appointment.   Conditions/risks identified: none  Next appointment: 09/08/2019 @ 8:15 am    Preventive Care 69 Years and Older, Female Preventive care refers to lifestyle choices and visits with your health care provider that can promote health and wellness. What does preventive care include?  A yearly physical exam. This is also called an annual well check.  Dental exams once or twice a year.  Routine eye exams. Ask your health care provider how often you should have your eyes checked.  Personal lifestyle choices, including:  Daily care of your teeth and gums.  Regular physical activity.  Eating a healthy diet.  Avoiding tobacco and drug use.  Limiting alcohol use.  Practicing safe sex.  Taking low-dose aspirin every day.  Taking vitamin and mineral supplements as recommended by your health care provider. What happens during an annual well check? The services and screenings done by your health care provider during your annual well check will depend on your age, overall health, lifestyle risk factors, and family history of disease. Counseling  Your health care provider may ask you questions about your:  Alcohol use.  Tobacco use.  Drug  use.  Emotional well-being.  Home and relationship well-being.  Sexual activity.  Eating habits.  History of falls.  Memory and ability to understand (cognition).  Work and work Astronomer.  Reproductive health. Screening  You may have the following tests or measurements:  Height, weight, and BMI.  Blood pressure.  Lipid and cholesterol levels. These may be checked every 5 years, or more frequently if you are over 39 years old.  Skin check.  Lung cancer screening. You may have this screening every year starting at age 69 if you have a 30-pack-year history of smoking and currently smoke or have quit within the past 15 years.  Fecal occult blood test (FOBT) of the stool. You may have this test every year starting at age 69.  Flexible sigmoidoscopy or colonoscopy. You may have a sigmoidoscopy every 5 years or a colonoscopy every 10 years starting at age 27.  Hepatitis C blood test.  Hepatitis B blood test.  Sexually transmitted disease (STD) testing.  Diabetes screening. This is done by checking your blood sugar (glucose) after you have not eaten for a while (fasting). You may have this done every 1-3 years.  Bone density scan. This is done to screen for osteoporosis. You may have this done starting at age 69.  Mammogram. This may be done every 1-2 years. Talk to your health care provider about how often you should have regular mammograms. Talk with your health care provider about your test results, treatment options, and if necessary, the need for more tests. Vaccines  Your health care provider may recommend certain vaccines, such as:  Influenza vaccine. This is recommended every year.  Tetanus, diphtheria, and acellular pertussis (Tdap, Td) vaccine.  You may need a Td booster every 10 years.  Zoster vaccine. You may need this after age 4.  Pneumococcal 13-valent conjugate (PCV13) vaccine. One dose is recommended after age 69.  Pneumococcal polysaccharide  (PPSV23) vaccine. One dose is recommended after age 55. Talk to your health care provider about which screenings and vaccines you need and how often you need them. This information is not intended to replace advice given to you by your health care provider. Make sure you discuss any questions you have with your health care provider. Document Released: 05/10/2015 Document Revised: 01/01/2016 Document Reviewed: 02/12/2015 Elsevier Interactive Patient Education  2017 [place] Prevention in the Home Falls can cause injuries. They can happen to people of all ages. There are many things you can do to make your home safe and to help prevent falls. What can I do on the outside of my home?  Regularly fix the edges of walkways and driveways and fix any cracks.  Remove anything that might make you trip as you walk through a door, such as a raised step or threshold.  Trim any bushes or trees on the path to your home.  Use bright outdoor lighting.  Clear any walking paths of anything that might make someone trip, such as rocks or tools.  Regularly check to see if handrails are loose or broken. Make sure that both sides of any steps have handrails.  Any raised decks and porches should have guardrails on the edges.  Have any leaves, snow, or ice cleared regularly.  Use sand or salt on walking paths during winter.  Clean up any spills in your garage right away. This includes oil or grease spills. What can I do in the bathroom?  Use night lights.  Install grab bars by the toilet and in the tub and shower. Do not use towel bars as grab bars.  Use non-skid mats or decals in the tub or shower.  If you need to sit down in the shower, use a plastic, non-slip stool.  Keep the floor dry. Clean up any water that spills on the floor as soon as it happens.  Remove soap buildup in the tub or shower regularly.  Attach bath mats securely with double-sided non-slip rug tape.  Do not have  throw rugs and other things on the floor that can make you trip. What can I do in the bedroom?  Use night lights.  Make sure that you have a light by your bed that is easy to reach.  Do not use any sheets or blankets that are too big for your bed. They should not hang down onto the floor.  Have a firm chair that has side arms. You can use this for support while you get dressed.  Do not have throw rugs and other things on the floor that can make you trip. What can I do in the kitchen?  Clean up any spills right away.  Avoid walking on wet floors.  Keep items that you use a lot in easy-to-reach places.  If you need to reach something above you, use a strong step stool that has a grab bar.  Keep electrical cords out of the way.  Do not use floor polish or wax that makes floors slippery. If you must use wax, use non-skid floor wax.  Do not have throw rugs and other things on the floor that can make you trip. What can I do with my stairs?  Do not leave any  items on the stairs.  Make sure that there are handrails on both sides of the stairs and use them. Fix handrails that are broken or loose. Make sure that handrails are as long as the stairways.  Check any carpeting to make sure that it is firmly attached to the stairs. Fix any carpet that is loose or worn.  Avoid having throw rugs at the top or bottom of the stairs. If you do have throw rugs, attach them to the floor with carpet tape.  Make sure that you have a light switch at the top of the stairs and the bottom of the stairs. If you do not have them, ask someone to add them for you. What else can I do to help prevent falls?  Wear shoes that:  Do not have high heels.  Have rubber bottoms.  Are comfortable and fit you well.  Are closed at the toe. Do not wear sandals.  If you use a stepladder:  Make sure that it is fully opened. Do not climb a closed stepladder.  Make sure that both sides of the stepladder are  locked into place.  Ask someone to hold it for you, if possible.  Clearly mark and make sure that you can see:  Any grab bars or handrails.  First and last steps.  Where the edge of each step is.  Use tools that help you move around (mobility aids) if they are needed. These include:  Canes.  Walkers.  Scooters.  Crutches.  Turn on the lights when you go into a dark area. Replace any light bulbs as soon as they burn out.  Set up your furniture so you have a clear path. Avoid moving your furniture around.  If any of your floors are uneven, fix them.  If there are any pets around you, be aware of where they are.  Review your medicines with your doctor. Some medicines can make you feel dizzy. This can increase your chance of falling. Ask your doctor what other things that you can do to help prevent falls. This information is not intended to replace advice given to you by your health care provider. Make sure you discuss any questions you have with your health care provider. Document Released: 02/07/2009 Document Revised: 09/19/2015 Document Reviewed: 05/18/2014 Elsevier Interactive Patient Education  2017 [employer].

## 2019-08-17 NOTE — Progress Notes (Signed)
PCP notes:  Health Maintenance: Colonoscopy- declined Dexa- declined Prevnar 13- declined   Abnormal Screenings: none   Patient concerns: none   Nurse concerns: none   Next PCP appt.: 09/08/2019 @ 8:15 am

## 2019-09-08 ENCOUNTER — Encounter: Payer: Medicare Other | Admitting: Family Medicine

## 2019-09-12 ENCOUNTER — Ambulatory Visit: Payer: Medicare Other | Admitting: Family Medicine

## 2019-09-22 ENCOUNTER — Other Ambulatory Visit: Payer: Self-pay

## 2019-09-22 ENCOUNTER — Encounter: Payer: Self-pay | Admitting: Family Medicine

## 2019-09-22 ENCOUNTER — Ambulatory Visit (INDEPENDENT_AMBULATORY_CARE_PROVIDER_SITE_OTHER): Payer: Medicare Other | Admitting: Family Medicine

## 2019-09-22 VITALS — BP 112/80 | HR 60 | Temp 97.7°F | Ht 62.0 in | Wt 168.4 lb

## 2019-09-22 DIAGNOSIS — Z0000 Encounter for general adult medical examination without abnormal findings: Secondary | ICD-10-CM

## 2019-09-22 DIAGNOSIS — Z7189 Other specified counseling: Secondary | ICD-10-CM

## 2019-09-22 DIAGNOSIS — M25569 Pain in unspecified knee: Secondary | ICD-10-CM

## 2019-09-22 DIAGNOSIS — Z13220 Encounter for screening for lipoid disorders: Secondary | ICD-10-CM | POA: Diagnosis not present

## 2019-09-22 DIAGNOSIS — Z1211 Encounter for screening for malignant neoplasm of colon: Secondary | ICD-10-CM

## 2019-09-22 LAB — CBC WITH DIFFERENTIAL/PLATELET
Basophils Absolute: 0 10*3/uL (ref 0.0–0.1)
Basophils Relative: 0.8 % (ref 0.0–3.0)
Eosinophils Absolute: 0.2 10*3/uL (ref 0.0–0.7)
Eosinophils Relative: 3.8 % (ref 0.0–5.0)
HCT: 38.8 % (ref 36.0–46.0)
Hemoglobin: 12.9 g/dL (ref 12.0–15.0)
Lymphocytes Relative: 30.1 % (ref 12.0–46.0)
Lymphs Abs: 1.2 10*3/uL (ref 0.7–4.0)
MCHC: 33.3 g/dL (ref 30.0–36.0)
MCV: 93.4 fl (ref 78.0–100.0)
Monocytes Absolute: 0.6 10*3/uL (ref 0.1–1.0)
Monocytes Relative: 15.2 % — ABNORMAL HIGH (ref 3.0–12.0)
Neutro Abs: 2.1 10*3/uL (ref 1.4–7.7)
Neutrophils Relative %: 50.1 % (ref 43.0–77.0)
Platelets: 215 10*3/uL (ref 150.0–400.0)
RBC: 4.15 Mil/uL (ref 3.87–5.11)
RDW: 14.2 % (ref 11.5–15.5)
WBC: 4.1 10*3/uL (ref 4.0–10.5)

## 2019-09-22 LAB — COMPREHENSIVE METABOLIC PANEL
ALT: 15 U/L (ref 0–35)
AST: 18 U/L (ref 0–37)
Albumin: 4 g/dL (ref 3.5–5.2)
Alkaline Phosphatase: 66 U/L (ref 39–117)
BUN: 22 mg/dL (ref 6–23)
CO2: 29 mEq/L (ref 19–32)
Calcium: 9.4 mg/dL (ref 8.4–10.5)
Chloride: 102 mEq/L (ref 96–112)
Creatinine, Ser: 0.63 mg/dL (ref 0.40–1.20)
GFR: 93.65 mL/min (ref >60.00)
Glucose, Bld: 87 mg/dL (ref 70–99)
Potassium: 4.5 mEq/L (ref 3.5–5.1)
Sodium: 137 mEq/L (ref 135–145)
Total Bilirubin: 0.4 mg/dL (ref 0.2–1.2)
Total Protein: 7.1 g/dL (ref 6.0–8.3)

## 2019-09-22 LAB — LIPID PANEL
Cholesterol: 222 mg/dL — ABNORMAL HIGH (ref 0–200)
HDL: 69.9 mg/dL (ref >39.00)
LDL Cholesterol: 143 mg/dL — ABNORMAL HIGH (ref 0–99)
NonHDL: 152.41
Total CHOL/HDL Ratio: 3
Triglycerides: 49 mg/dL (ref 0.0–149.0)
VLDL: 9.8 mg/dL (ref 0.0–40.0)

## 2019-09-22 NOTE — Patient Instructions (Addendum)
Ask the front to get a copy of your mammogram done 2021 at [facility] care.   We'll set up cologuard.  Update me as needed.  I'll update ortho that you are appropriately low risk for surgery.  Take care.  Glad to see you.

## 2019-09-22 NOTE — Progress Notes (Signed)
This visit occurred during the SARS-CoV-2 public health emergency.  Safety protocols were in place, including screening questions prior to the visit, additional usage of staff PPE, and extensive cleaning of exam room while observing appropriate contact time as indicated for disinfecting solutions.  She had prev R knee surgery with L knee surgery pending. D/w pt about preop eval.  She has stopped tumeric in the meantime, until surgery.  She is avoiding nsaids but taking tylenol.  No CP, SOB.  She has some BLE edema at baseline for years, that isn't new.  No COPD, MI, CVA.  No h/o DVT.  She has tolerated surgery prev.  She can walk a few miles but with knee pain.  She has orthopedic but not cardiovascular limitation for exercise.  She is going to do outpatient PT after surgery.  We talked about her granddaughter, who had died in childhood from HUS.  Condolences offered.   Son Barbara Cower designated if patient were incapacitated. Mammogram done 2021 at [facility] care.   Defer DXA at this point with surgery pending.   D/w patient [id number] for colon cancer screening, including IFOB vs. colonoscopy.  Risks and benefits of both were discussed and patient voiced understanding.  Pt elects for cologuard.  Diet and exercise d/w pt.  Intentional weight loss.  She is doing PT at home to get ready for surgery.   Vaccination d/w pt.  Encouraged covid vaccine.    See notes on labs.  PMH and SH reviewed  ROS: Per HPI unless specifically indicated in ROS section   Meds, vitals, and allergies reviewed.   nad ncat Neck supple, no LA rrr ctab L knee with crepitus on ROM.  Able to bear weight. Skin well perfused.  At least 30 minutes were devoted to patient care in this encounter (this can potentially include time spent reviewing the patient's file/history, interviewing and examining the patient, counseling/reviewing plan with patient, ordering referrals, ordering tests, reviewing relevant laboratory or  x-ray data, and documenting the encounter).

## 2019-09-25 ENCOUNTER — Encounter: Payer: Self-pay | Admitting: Family Medicine

## 2019-09-25 NOTE — Assessment & Plan Note (Signed)
[name] designated if patient were incapacitated. Mammogram done 2021 at [facility] care.   Defer DXA at this point with surgery pending.   D/w patient [id number] for colon cancer screening, including IFOB vs. colonoscopy.  Risks and benefits of both were discussed and patient voiced understanding.  Pt elects for cologuard.  Diet and exercise d/w pt.  Intentional weight loss.  She is doing PT at home to get ready for surgery.   Vaccination d/w pt.  Encouraged covid vaccine.

## 2019-09-25 NOTE — Assessment & Plan Note (Signed)
We talked about preop evaluation in general and that we cannot "clear" the patient for surgery but we can try to estimate if the patient is appropriately low risk for surgery.  It does appear that she is appropriately low risk for surgery and the benefit of surgery would likely greatly outweigh the risk.  We will update orthopedics in the meantime and I appreciate the help of all involved.

## 2019-09-25 NOTE — Assessment & Plan Note (Signed)
Son Jason designated if patient were incapacitated. 

## 2019-09-28 ENCOUNTER — Other Ambulatory Visit: Payer: Self-pay | Admitting: Orthopedic Surgery

## 2019-09-28 ENCOUNTER — Encounter: Payer: Self-pay | Admitting: Orthopedic Surgery

## 2019-09-28 NOTE — H&P (Signed)
Denise Norris MRN:  [id number] DOB/SEX:  [birth year]/female  CHIEF COMPLAINT:  Painful left Knee  HISTORY: Patient is a 69 y.o. female presented with a history of pain in the left knee. Onset of symptoms was gradual starting several years ago with gradually worsening course since that time. Prior procedures on the knee include none. Patient has been treated conservatively with over-the-counter NSAIDs and activity modification. Patient currently rates pain in the knee at 10 out of 10 with activity. There is pain at night.  PAST MEDICAL HISTORY: Patient Active Problem List   Diagnosis Date Noted  . 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 Shoulder pain 03/13/2018  . Knee pain 03/13/2018  . Advance care planning 03/13/2018  . Healthcare maintenance 03/13/2018   Past Medical History:  Diagnosis Date  . Arthritis   . Connective tissue disorder ([place])    Unspecified, previously followed by [facility].  Marland Kitchen GERD (gastroesophageal reflux disease)   . Glaucoma   . Migraine    Past Surgical History:  Procedure Laterality Date  . CARPAL TUNNEL RELEASE Bilateral   . CATARACT EXTRACTION Bilateral   . CERVICAL SPINE SURGERY     C5/C6 repair.  Previous right arm radiculopathy resolved with surgery.  Marland Kitchen TOTAL KNEE ARTHROPLASTY Right 02/01/2019   Procedure: TOTAL KNEE ARTHROPLASTY;  Surgeon: Lovell Sheehan, MD;  Location: [facility] ORS;  Service: Orthopedics;  Laterality: Right;     MEDICATIONS:  (Not in a hospital admission)   ALLERGIES:   Allergies  Allergen Reactions  . Codeine Hives    REVIEW OF SYSTEMS:  Pertinent items are noted in HPI.   FAMILY HISTORY:   Family History  Problem Relation Age of Onset  . Breast cancer Sister   . Colon cancer Neg Hx     SOCIAL HISTORY:   Social History   Tobacco Use  . Smoking status: Never Smoker  . Smokeless tobacco: Never Used  Substance Use Topics  . Alcohol use: Never     EXAMINATION:  Vital signs in last 24  hours: @VSRANGES @  General appearance: alert, cooperative and no distress Neck: no JVD, supple, symmetrical, trachea midline and thyroid not enlarged, symmetric, no tenderness/mass/nodules Lungs: clear to auscultation bilaterally Heart: regular rate and rhythm, S1, S2 normal, no murmur, click, rub or gallop Abdomen: soft, non-tender; bowel sounds normal; no masses,  no organomegaly Extremities: extremities normal, atraumatic, no cyanosis or edema and Homans sign is negative, no sign of DVT Pulses: 2+ and symmetric Skin: Skin color, texture, turgor normal. No rashes or lesions Lymph nodes: Cervical, supraclavicular, and axillary nodes normal. Neurologic: Alert and oriented X 3, normal strength and tone. Normal symmetric reflexes. Normal coordination and gait  Musculoskeletal:  ROM 0-100, Ligaments intact,  Imaging Review Plain radiographs demonstrate severe degenerative joint disease of the left knee. The overall alignment is significant varus. The bone quality appears to be good for age and reported activity level.  Assessment/Plan: Primary osteoarthritis, left knee   The patient history, physical examination and imaging studies are consistent with advanced degenerative joint disease of the left knee. The patient has failed conservative treatment.  The clearance notes were reviewed.  After discussion with the patient it was felt that Total Knee Replacement was indicated. The procedure,  risks, and benefits of total knee arthroplasty were presented and reviewed. The risks including but not limited to aseptic loosening, infection, blood clots, vascular injury, stiffness, patella tracking problems complications among others were discussed. The patient acknowledged the explanation, agreed  to proceed with the plan.  Altamese Cabal 09/28/2019, 1:23 PM

## 2019-09-28 NOTE — H&P (Deleted)
  The note originally documented on this encounter has been moved the the encounter in which it belongs.  

## 2019-09-29 ENCOUNTER — Encounter
Admission: RE | Admit: 2019-09-29 | Discharge: 2019-09-29 | Disposition: A | Discharge status: Home / Self Care | Payer: Medicare Other | Source: Ambulatory Visit | Attending: Orthopedic Surgery | Admitting: Orthopedic Surgery

## 2019-09-29 ENCOUNTER — Other Ambulatory Visit: Payer: Self-pay

## 2019-09-29 NOTE — Patient Instructions (Addendum)
COVID TESTING Date: Monday, June 14 Testing site:  [facility] [address] Thru Hours:  5:36 am - 1:00 pm Once you are tested, you are asked to stay quarantined (avoiding public places) until after your surgery.   Your procedure is scheduled on: Wednesday, June 16 Report to Day Surgery on the 2nd floor of the [facility]. To find out your arrival time, please call [phone] between 1PM - 3PM on: Tuesday, June 15  REMEMBER: Instructions that are not followed completely may result in serious medical risk, up to and including death; or upon the discretion of your surgeon and anesthesiologist your surgery may need to be rescheduled.  Do not eat food after midnight the night before surgery.  No gum chewing, lozengers or hard candies.  You may however, drink CLEAR liquids up to 2 hours before you are scheduled to arrive for your surgery. Do not drink anything within 2 hours of your scheduled arrival time.  Clear liquids include: - water  - apple juice without pulp - gatorade (not RED) - black coffee or tea (Do NOT add milk or creamers to the coffee or tea) Do NOT drink anything that is not on this list.  TAKE THESE MEDICATIONS THE MORNING OF SURGERY WITH A SIP OF WATER:  1.  Tylenol if needed for pain 2.  Famotidine (pepcid) - (take one the night before and one on the morning of surgery - helps to prevent nausea after surgery.)  1 week prior to surgery: Stop Anti-inflammatories (NSAIDS) such as Advil, Aleve, Ibuprofen, Motrin, Naproxen, Naprosyn and Aspirin based products such as Excedrin, Goodys Powder, BC Powder. (May take Tylenol or Acetaminophen if needed.)  Stop ANY OVER THE COUNTER supplements until after surgery. (fish oil, turmeric)  No Alcohol for 24 hours before or after surgery.  On the morning of surgery brush your teeth with toothpaste and water, you may rinse your mouth with mouthwash if you wish. Do not swallow any toothpaste or  mouthwash.  Do not wear jewelry, make-up, hairpins, clips or nail polish.  Do not wear lotions, powders, or perfumes.   Do not shave 48 hours prior to surgery.   Do not bring valuables to the hospital. [facility] is not responsible for any missing/lost belongings or valuables.   Use CHG Soap as directed on instruction sheet.  Notify your doctor if there is any change in your medical condition (cold, fever, infection).  Wear comfortable clothing (specific to your surgery type) to the hospital.  Plan for stool softeners for home use; pain medications have a tendency to cause constipation. You can also help prevent constipation by eating foods high in fiber such as fruits and vegetables and drinking plenty of fluids as your diet allows.  After surgery, you can help prevent lung complications by doing breathing exercises.  Take deep breaths and cough every 1-2 hours. Your doctor may order a device called an Incentive Spirometer to help you take deep breaths.  If you are being admitted to the hospital overnight, leave your suitcase in the car. After surgery it may be brought to your room.  If you are taking public transportation, you will need to have a responsible adult (18 years or older) with you. Please confirm with your physician that it is acceptable to use public transportation.   Please call the [place] Dept. at [phone] if you have any questions about these instructions.  Visitation Policy:  Patients undergoing a surgery or procedure may  have one family member or support person with them as long as that person is not COVID-19 positive or experiencing its symptoms.  That person may remain in the waiting area during the procedure.  Inpatient Visitation Update:   Two designated support people may visit a patient during visiting hours 7 am to 8 pm. It must be the same two designated people for the duration of the patient stay. The visitors may come and go  during the day, and there is no switching out to have different visitors. A mask must be worn at all times, including in the patient room.  As a reminder, masks are still required for all [facility] team members, patients and visitors in all [facility] facilities.   Systemwide, no visitors 17 or younger.

## 2019-10-03 ENCOUNTER — Encounter
Admission: RE | Admit: 2019-10-03 | Discharge: 2019-10-03 | Disposition: A | Discharge status: Home / Self Care | Payer: Medicare Other | Source: Ambulatory Visit | Attending: Orthopedic Surgery | Admitting: Orthopedic Surgery

## 2019-10-03 ENCOUNTER — Other Ambulatory Visit: Payer: Self-pay

## 2019-10-03 DIAGNOSIS — M25562 Pain in left knee: Secondary | ICD-10-CM | POA: Diagnosis not present

## 2019-10-03 DIAGNOSIS — Z01818 Encounter for other preprocedural examination: Secondary | ICD-10-CM | POA: Insufficient documentation

## 2019-10-03 DIAGNOSIS — M25612 Stiffness of left shoulder, not elsewhere classified: Secondary | ICD-10-CM | POA: Diagnosis not present

## 2019-10-03 LAB — PROTIME-INR
INR: 1 (ref 0.8–1.2)
Prothrombin Time: 12.5 seconds (ref 11.4–15.2)

## 2019-10-03 LAB — TYPE AND SCREEN
ABO/RH(D): AB POS
Antibody Screen: NEGATIVE

## 2019-10-03 LAB — URINALYSIS, ROUTINE W REFLEX MICROSCOPIC
Bacteria, UA: NONE SEEN
Bilirubin Urine: NEGATIVE
Glucose, UA: NEGATIVE mg/dL
Hgb urine dipstick: NEGATIVE
Ketones, ur: NEGATIVE mg/dL
Nitrite: NEGATIVE
Protein, ur: NEGATIVE mg/dL
Specific Gravity, Urine: 1.025 (ref 1.005–1.030)
pH: 6 (ref 5.0–8.0)

## 2019-10-03 LAB — APTT: aPTT: 30 seconds (ref 24–36)

## 2019-10-03 LAB — SURGICAL PCR SCREEN
MRSA, PCR: NEGATIVE
Staphylococcus aureus: NEGATIVE

## 2019-10-03 NOTE — Addendum Note (Signed)
Addended by: Annamarie Major on: 10/03/2019 04:50 PM   Modules accepted: Orders

## 2019-10-09 ENCOUNTER — Other Ambulatory Visit
Admission: RE | Admit: 2019-10-09 | Discharge: 2019-10-09 | Disposition: A | Discharge status: Home / Self Care | Payer: Medicare Other | Source: Ambulatory Visit | Attending: Orthopedic Surgery | Admitting: Orthopedic Surgery

## 2019-10-09 ENCOUNTER — Other Ambulatory Visit: Payer: Self-pay

## 2019-10-09 DIAGNOSIS — Z01812 Encounter for preprocedural laboratory examination: Secondary | ICD-10-CM | POA: Diagnosis present

## 2019-10-09 DIAGNOSIS — Z20822 Contact with and (suspected) exposure to covid-19: Secondary | ICD-10-CM | POA: Diagnosis not present

## 2019-10-10 LAB — SARS CORONAVIRUS 2 (TAT 6-24 HRS): SARS Coronavirus 2: NEGATIVE

## 2019-10-11 ENCOUNTER — Ambulatory Visit: Payer: Medicare Other | Admitting: Certified Registered"

## 2019-10-11 ENCOUNTER — Observation Stay
Admission: RE | Admit: 2019-10-11 | Discharge: 2019-10-12 | Disposition: A | Discharge status: Home / Self Care | Payer: Medicare Other | Attending: Orthopedic Surgery | Admitting: Orthopedic Surgery

## 2019-10-11 ENCOUNTER — Other Ambulatory Visit: Payer: Self-pay

## 2019-10-11 ENCOUNTER — Encounter
Admission: RE | Disposition: A | Discharge status: Home / Self Care | Payer: Self-pay | Source: Home / Self Care | Attending: Orthopedic Surgery

## 2019-10-11 ENCOUNTER — Ambulatory Visit: Payer: Medicare Other

## 2019-10-11 ENCOUNTER — Inpatient Hospital Stay: Payer: Medicare Other

## 2019-10-11 ENCOUNTER — Encounter: Payer: Self-pay | Admitting: Orthopedic Surgery

## 2019-10-11 DIAGNOSIS — M25862 Other specified joint disorders, left knee: Secondary | ICD-10-CM | POA: Diagnosis not present

## 2019-10-11 DIAGNOSIS — M1712 Unilateral primary osteoarthritis, left knee: Secondary | ICD-10-CM | POA: Diagnosis not present

## 2019-10-11 DIAGNOSIS — Z96652 Presence of left artificial knee joint: Secondary | ICD-10-CM

## 2019-10-11 DIAGNOSIS — Z09 Encounter for follow-up examination after completed treatment for conditions other than malignant neoplasm: Secondary | ICD-10-CM

## 2019-10-11 DIAGNOSIS — Z96651 Presence of right artificial knee joint: Secondary | ICD-10-CM | POA: Diagnosis not present

## 2019-10-11 DIAGNOSIS — M25762 Osteophyte, left knee: Secondary | ICD-10-CM | POA: Diagnosis not present

## 2019-10-11 DIAGNOSIS — Z7901 Long term (current) use of anticoagulants: Secondary | ICD-10-CM | POA: Diagnosis not present

## 2019-10-11 DIAGNOSIS — H409 Unspecified glaucoma: Secondary | ICD-10-CM | POA: Diagnosis not present

## 2019-10-11 DIAGNOSIS — Z96659 Presence of unspecified artificial knee joint: Secondary | ICD-10-CM

## 2019-10-11 DIAGNOSIS — K219 Gastro-esophageal reflux disease without esophagitis: Secondary | ICD-10-CM | POA: Diagnosis not present

## 2019-10-11 DIAGNOSIS — Z79899 Other long term (current) drug therapy: Secondary | ICD-10-CM | POA: Diagnosis not present

## 2019-10-11 DIAGNOSIS — Z885 Allergy status to narcotic agent status: Secondary | ICD-10-CM | POA: Diagnosis not present

## 2019-10-11 DIAGNOSIS — Z419 Encounter for procedure for purposes other than remedying health state, unspecified: Secondary | ICD-10-CM

## 2019-10-11 HISTORY — PX: TOTAL KNEE ARTHROPLASTY: SHX125

## 2019-10-11 SURGERY — ARTHROPLASTY, KNEE, TOTAL
Anesthesia: Spinal | Site: Knee | Laterality: Left

## 2019-10-11 MED ORDER — CEFAZOLIN SODIUM-DEXTROSE 2-4 GM/100ML-% IV SOLN
2.0000 g | Freq: Four times a day (QID) | INTRAVENOUS | Status: AC
  Administered 2019-10-11 – 2019-10-12 (×2): 2 g via INTRAVENOUS
  Filled 2019-10-11 (×3): qty 100

## 2019-10-11 MED ORDER — SODIUM CHLORIDE FLUSH 0.9 % IV SOLN
INTRAVENOUS | Status: AC
  Filled 2019-10-11: qty 40

## 2019-10-11 MED ORDER — BUPIVACAINE HCL (PF) 0.5 % IJ SOLN
INTRAMUSCULAR | Status: AC
  Filled 2019-10-11: qty 30

## 2019-10-11 MED ORDER — ACETAMINOPHEN 500 MG PO TABS
500.0000 mg | ORAL_TABLET | Freq: Four times a day (QID) | ORAL | Status: AC
  Administered 2019-10-11 – 2019-10-12 (×4): 500 mg via ORAL
  Filled 2019-10-11 (×4): qty 1

## 2019-10-11 MED ORDER — SODIUM CHLORIDE 0.9 % IR SOLN
Status: DC | PRN
  Administered 2019-10-11: 1500 mL

## 2019-10-11 MED ORDER — FENTANYL CITRATE (PF) 100 MCG/2ML IJ SOLN
INTRAMUSCULAR | Status: AC
  Administered 2019-10-11: 50 ug via INTRAVENOUS
  Filled 2019-10-11: qty 2

## 2019-10-11 MED ORDER — DEXAMETHASONE SODIUM PHOSPHATE 10 MG/ML IJ SOLN
INTRAMUSCULAR | Status: DC | PRN
Start: 2019-10-11 — End: 2019-10-11
  Administered 2019-10-11: 10 mg via INTRAVENOUS
  Administered 2019-10-11: 4 mg via INTRAVENOUS

## 2019-10-11 MED ORDER — ACETAMINOPHEN 325 MG PO TABS: 325.0000 mg | ORAL_TABLET | Freq: Four times a day (QID) | ORAL | Status: DC | PRN

## 2019-10-11 MED ORDER — DEXAMETHASONE SODIUM PHOSPHATE 10 MG/ML IJ SOLN
INTRAMUSCULAR | Status: AC
  Filled 2019-10-11: qty 1

## 2019-10-11 MED ORDER — MAGNESIUM CITRATE PO SOLN
1.0000 | Freq: Once | ORAL | Status: DC | PRN
  Filled 2019-10-11 (×2): qty 296

## 2019-10-11 MED ORDER — CHLORHEXIDINE GLUCONATE 0.12 % MT SOLN
OROMUCOSAL | Status: AC
  Filled 2019-10-11: qty 15

## 2019-10-11 MED ORDER — DIPHENHYDRAMINE HCL 12.5 MG/5ML PO ELIX: 12.5000 mg | ORAL_SOLUTION | ORAL | Status: DC | PRN

## 2019-10-11 MED ORDER — EPINEPHRINE PF 1 MG/ML IJ SOLN
INTRAMUSCULAR | Status: AC
  Filled 2019-10-11: qty 1

## 2019-10-11 MED ORDER — ORAL CARE MOUTH RINSE: 15.0000 mL | Freq: Once | OROMUCOSAL | Status: AC

## 2019-10-11 MED ORDER — ONDANSETRON HCL 4 MG/2ML IJ SOLN: 4.0000 mg | Freq: Once | INTRAMUSCULAR | Status: DC | PRN

## 2019-10-11 MED ORDER — ONDANSETRON HCL 4 MG/2ML IJ SOLN: 4.0000 mg | Freq: Four times a day (QID) | INTRAMUSCULAR | Status: DC | PRN

## 2019-10-11 MED ORDER — METOCLOPRAMIDE HCL 10 MG PO TABS: 5.0000 mg | ORAL_TABLET | Freq: Three times a day (TID) | ORAL | Status: DC | PRN

## 2019-10-11 MED ORDER — RIVAROXABAN 10 MG PO TABS
10.0000 mg | ORAL_TABLET | Freq: Every day | ORAL | Status: DC
  Administered 2019-10-12: 10 mg via ORAL
  Filled 2019-10-11: qty 1

## 2019-10-11 MED ORDER — FAMOTIDINE 20 MG PO TABS: 20.0000 mg | ORAL_TABLET | Freq: Two times a day (BID) | ORAL | Status: DC | PRN

## 2019-10-11 MED ORDER — FENTANYL CITRATE (PF) 100 MCG/2ML IJ SOLN
INTRAMUSCULAR | Status: DC | PRN
  Administered 2019-10-11: 50 ug via INTRAVENOUS
  Administered 2019-10-11 (×2): 25 ug via INTRAVENOUS

## 2019-10-11 MED ORDER — GLYCOPYRROLATE 0.2 MG/ML IJ SOLN
INTRAMUSCULAR | Status: DC | PRN
Start: 2019-10-11 — End: 2019-10-11
  Administered 2019-10-11: .2 mg via INTRAVENOUS

## 2019-10-11 MED ORDER — OXYCODONE HCL 5 MG PO TABS: 5.0000 mg | ORAL_TABLET | Freq: Once | ORAL | Status: DC | PRN

## 2019-10-11 MED ORDER — MORPHINE SULFATE (PF) 2 MG/ML IV SOLN: 0.5000 mg | INTRAVENOUS | Status: DC | PRN

## 2019-10-11 MED ORDER — BUPIVACAINE HCL (PF) 0.25 % IJ SOLN
INTRAMUSCULAR | Status: AC
  Filled 2019-10-11: qty 30

## 2019-10-11 MED ORDER — FENTANYL CITRATE (PF) 100 MCG/2ML IJ SOLN
INTRAMUSCULAR | Status: AC
  Filled 2019-10-11: qty 2

## 2019-10-11 MED ORDER — OXYCODONE HCL 5 MG/5ML PO SOLN: 5.0000 mg | Freq: Once | ORAL | Status: DC | PRN

## 2019-10-11 MED ORDER — CHLORHEXIDINE GLUCONATE 0.12 % MT SOLN: 15.0000 mL | Freq: Once | OROMUCOSAL | Status: DC

## 2019-10-11 MED ORDER — HYDROCODONE-ACETAMINOPHEN 7.5-325 MG PO TABS: 1.0000 | ORAL_TABLET | ORAL | Status: DC | PRN

## 2019-10-11 MED ORDER — ONDANSETRON HCL 4 MG/2ML IJ SOLN
INTRAMUSCULAR | Status: DC | PRN
  Administered 2019-10-11: 4 mg via INTRAVENOUS

## 2019-10-11 MED ORDER — ONDANSETRON HCL 4 MG PO TABS: 4.0000 mg | ORAL_TABLET | Freq: Four times a day (QID) | ORAL | Status: DC | PRN

## 2019-10-11 MED ORDER — SODIUM CHLORIDE 0.9 % IV SOLN
INTRAVENOUS | Status: DC | PRN
  Administered 2019-10-11: 60 mL

## 2019-10-11 MED ORDER — FENTANYL CITRATE (PF) 100 MCG/2ML IJ SOLN: 25.0000 ug | INTRAMUSCULAR | Status: DC | PRN

## 2019-10-11 MED ORDER — CEFAZOLIN SODIUM-DEXTROSE 2-4 GM/100ML-% IV SOLN
INTRAVENOUS | Status: AC
  Filled 2019-10-11: qty 100

## 2019-10-11 MED ORDER — SODIUM CHLORIDE (PF) 0.9 % IJ SOLN
INTRAMUSCULAR | Status: AC
  Filled 2019-10-11: qty 20

## 2019-10-11 MED ORDER — BUPIVACAINE HCL (PF) 0.25 % IJ SOLN
INTRAMUSCULAR | Status: DC | PRN
Start: 2019-10-11 — End: 2019-10-11
  Administered 2019-10-11: 25 mL

## 2019-10-11 MED ORDER — CHLORHEXIDINE GLUCONATE 0.12 % MT SOLN
15.0000 mL | Freq: Once | OROMUCOSAL | Status: AC
  Administered 2019-10-11: 15 mL via OROMUCOSAL

## 2019-10-11 MED ORDER — SODIUM CHLORIDE 0.9 % IV SOLN
INTRAVENOUS | Status: DC | PRN
  Administered 2019-10-11: 25 ug/min via INTRAVENOUS

## 2019-10-11 MED ORDER — MIDAZOLAM HCL 2 MG/2ML IJ SOLN
INTRAMUSCULAR | Status: AC
  Filled 2019-10-11: qty 2

## 2019-10-11 MED ORDER — BUPIVACAINE HCL (PF) 0.5 % IJ SOLN
INTRAMUSCULAR | Status: DC | PRN
  Administered 2019-10-11: 2.5 mL via INTRATHECAL

## 2019-10-11 MED ORDER — BUPIVACAINE LIPOSOME 1.3 % IJ SUSP
INTRAMUSCULAR | Status: AC
  Filled 2019-10-11: qty 20

## 2019-10-11 MED ORDER — ACETAMINOPHEN 10 MG/ML IV SOLN
INTRAVENOUS | Status: DC | PRN
  Administered 2019-10-11: 1000 mg via INTRAVENOUS

## 2019-10-11 MED ORDER — TRANEXAMIC ACID-NACL 1000-0.7 MG/100ML-% IV SOLN
INTRAVENOUS | Status: AC
  Filled 2019-10-11: qty 100

## 2019-10-11 MED ORDER — MIDAZOLAM HCL 2 MG/2ML IJ SOLN: 1.0000 mg | Freq: Once | INTRAMUSCULAR | Status: AC

## 2019-10-11 MED ORDER — LACTATED RINGERS IV SOLN: INTRAVENOUS | Status: DC

## 2019-10-11 MED ORDER — MENTHOL 3 MG MT LOZG
1.0000 | LOZENGE | OROMUCOSAL | Status: DC | PRN
  Filled 2019-10-11: qty 9

## 2019-10-11 MED ORDER — PHENOL 1.4 % MT LIQD
1.0000 | OROMUCOSAL | Status: DC | PRN
  Filled 2019-10-11: qty 177

## 2019-10-11 MED ORDER — ALUM & MAG HYDROXIDE-SIMETH 200-200-20 MG/5ML PO SUSP: 30.0000 mL | ORAL | Status: DC | PRN

## 2019-10-11 MED ORDER — CEFAZOLIN SODIUM-DEXTROSE 2-4 GM/100ML-% IV SOLN
2.0000 g | INTRAVENOUS | Status: AC
  Administered 2019-10-11: 2 g via INTRAVENOUS

## 2019-10-11 MED ORDER — FENTANYL CITRATE (PF) 100 MCG/2ML IJ SOLN: 50.0000 ug | Freq: Once | INTRAMUSCULAR | Status: AC

## 2019-10-11 MED ORDER — DOCUSATE SODIUM 100 MG PO CAPS
100.0000 mg | ORAL_CAPSULE | Freq: Two times a day (BID) | ORAL | Status: DC
  Administered 2019-10-11 – 2019-10-12 (×2): 100 mg via ORAL
  Filled 2019-10-11 (×2): qty 1

## 2019-10-11 MED ORDER — HYDROCODONE-ACETAMINOPHEN 5-325 MG PO TABS
1.0000 | ORAL_TABLET | ORAL | Status: DC | PRN
  Administered 2019-10-11 – 2019-10-12 (×3): 2 via ORAL
  Filled 2019-10-11 (×3): qty 2

## 2019-10-11 MED ORDER — BACITRACIN 50000 UNITS IM SOLR
INTRAMUSCULAR | Status: AC
  Filled 2019-10-11: qty 1

## 2019-10-11 MED ORDER — METOCLOPRAMIDE HCL 5 MG/ML IJ SOLN: 5.0000 mg | Freq: Three times a day (TID) | INTRAMUSCULAR | Status: DC | PRN

## 2019-10-11 MED ORDER — ACETAMINOPHEN 10 MG/ML IV SOLN: 1000.0000 mg | Freq: Once | INTRAVENOUS | Status: DC | PRN

## 2019-10-11 MED ORDER — MIDAZOLAM HCL 5 MG/5ML IJ SOLN
INTRAMUSCULAR | Status: DC | PRN
  Administered 2019-10-11 (×2): 1 mg via INTRAVENOUS

## 2019-10-11 MED ORDER — TRANEXAMIC ACID-NACL 1000-0.7 MG/100ML-% IV SOLN
1000.0000 mg | INTRAVENOUS | Status: AC
  Administered 2019-10-11: 1000 mg via INTRAVENOUS

## 2019-10-11 MED ORDER — BUPIVACAINE-EPINEPHRINE (PF) 0.5% -1:200000 IJ SOLN
INTRAMUSCULAR | Status: DC | PRN
  Administered 2019-10-11: 30 mL

## 2019-10-11 MED ORDER — BUPIVACAINE HCL (PF) 0.5 % IJ SOLN
INTRAMUSCULAR | Status: AC
  Filled 2019-10-11: qty 20

## 2019-10-11 MED ORDER — KETOROLAC TROMETHAMINE 15 MG/ML IJ SOLN
15.0000 mg | Freq: Four times a day (QID) | INTRAMUSCULAR | Status: AC
  Administered 2019-10-11 – 2019-10-12 (×4): 15 mg via INTRAVENOUS
  Filled 2019-10-11 (×4): qty 1

## 2019-10-11 MED ORDER — MIDAZOLAM HCL 2 MG/2ML IJ SOLN
INTRAMUSCULAR | Status: AC
  Administered 2019-10-11: 1 mg via INTRAVENOUS
  Filled 2019-10-11: qty 2

## 2019-10-11 MED ORDER — PROPOFOL 500 MG/50ML IV EMUL
INTRAVENOUS | Status: DC | PRN
  Administered 2019-10-11: 100 ug/kg/min via INTRAVENOUS

## 2019-10-11 MED ORDER — BISACODYL 10 MG RE SUPP: 10.0000 mg | Freq: Every day | RECTAL | Status: DC | PRN

## 2019-10-11 MED ORDER — POVIDONE-IODINE 10 % EX SWAB: 2.0000 "application " | Freq: Once | CUTANEOUS | Status: DC

## 2019-10-11 MED ORDER — TIMOLOL MALEATE 0.5 % OP SOLN
1.0000 [drp] | Freq: Two times a day (BID) | OPHTHALMIC | Status: DC
  Administered 2019-10-11 – 2019-10-12 (×2): 1 [drp] via OPHTHALMIC
  Filled 2019-10-11: qty 5

## 2019-10-11 SURGICAL SUPPLY — 62 items
BASEPLATE TIBIAL LT SZ3 (Knees) ×2 IMPLANT
BLADE SAW 18WX90L 1.27 THK (BLADE) ×3 IMPLANT
BLADE SAW SAG 25X90X1.19 (BLADE) ×3 IMPLANT
BOWL CEMENT MIX W/ADAPTER (MISCELLANEOUS) ×3 IMPLANT
BRUSH SCRUB EZ  4% CHG (MISCELLANEOUS) ×4
BRUSH SCRUB EZ 4% CHG (MISCELLANEOUS) ×2 IMPLANT
CANISTER SUCT 1200ML W/VALVE (MISCELLANEOUS) ×3 IMPLANT
CANISTER SUCT 3000ML PPV (MISCELLANEOUS) ×6 IMPLANT
CEMENT BONE 1-PACK (Cement) ×4 IMPLANT
CEMENT BONE 40GM (Cement) ×2 IMPLANT
CHLORAPREP W/TINT 26 (MISCELLANEOUS) ×6 IMPLANT
COMP FEMORAL SZ 4 LEFT NARROW (Orthopedic Implant) ×3 IMPLANT
COMP PATELLA GENESIS 29 OVAL (Orthopedic Implant) ×3 IMPLANT
COMPONENT FEMRL SZ4 LT NARROW (Orthopedic Implant) IMPLANT
COMPONENT PTLLA GENS 29 OVAL (Orthopedic Implant) IMPLANT
COOLER POLAR GLACIER W/PUMP (MISCELLANEOUS) ×3 IMPLANT
COVER WAND RF STERILE (DRAPES) ×3 IMPLANT
CUFF TOURN SGL QUICK 30 (TOURNIQUET CUFF) ×2
CUFF TRNQT CYL 30X4X21-28X (TOURNIQUET CUFF) ×1 IMPLANT
DRAPE 3/4 80X56 (DRAPES) ×6 IMPLANT
DRAPE INCISE IOBAN 66X60 STRL (DRAPES) ×3 IMPLANT
ELECT REM PT RETURN 9FT ADLT (ELECTROSURGICAL) ×3
ELECTRODE REM PT RTRN 9FT ADLT (ELECTROSURGICAL) ×1 IMPLANT
GAUZE SPONGE 4X4 12PLY STRL (GAUZE/BANDAGES/DRESSINGS) ×3 IMPLANT
GAUZE XEROFORM 1X8 LF (GAUZE/BANDAGES/DRESSINGS) ×3 IMPLANT
GLOVE BIO SURGEON STRL SZ8 (GLOVE) ×3 IMPLANT
GLOVE BIOGEL PI IND STRL 8.5 (GLOVE) ×1 IMPLANT
GLOVE BIOGEL PI INDICATOR 8.5 (GLOVE) ×2
GLOVE INDICATOR 8.0 STRL GRN (GLOVE) ×3 IMPLANT
GLOVE SURG ORTHO 8.0 STRL STRW (GLOVE) ×9 IMPLANT
GOWN STRL REUS W/ TWL LRG LVL3 (GOWN DISPOSABLE) ×1 IMPLANT
GOWN STRL REUS W/ TWL XL LVL3 (GOWN DISPOSABLE) ×1 IMPLANT
GOWN STRL REUS W/TWL LRG LVL3 (GOWN DISPOSABLE) ×2
GOWN STRL REUS W/TWL XL LVL3 (GOWN DISPOSABLE) ×2
HOOD PEEL AWAY FLYTE STAYCOOL (MISCELLANEOUS) ×9 IMPLANT
INSERT ARTI HI FLEX 11 SZ 3-4 (Insert) ×2 IMPLANT
IV NS 1000ML (IV SOLUTION) ×2
IV NS 1000ML BAXH (IV SOLUTION) ×1 IMPLANT
KIT TURNOVER KIT A (KITS) ×3 IMPLANT
MAT ABSORB  FLUID 56X50 GRAY (MISCELLANEOUS) ×2
MAT ABSORB FLUID 56X50 GRAY (MISCELLANEOUS) ×1 IMPLANT
NDL SAFETY ECLIPSE 18X1.5 (NEEDLE) ×1 IMPLANT
NDL SPNL 20GX3.5 QUINCKE YW (NEEDLE) ×1 IMPLANT
NEEDLE HYPO 18GX1.5 SHARP (NEEDLE) ×2
NEEDLE SPNL 20GX3.5 QUINCKE YW (NEEDLE) ×3 IMPLANT
NS IRRIG 1000ML POUR BTL (IV SOLUTION) ×3 IMPLANT
PACK TOTAL KNEE (MISCELLANEOUS) ×3 IMPLANT
PAD DE MAYO PRESSURE PROTECT (MISCELLANEOUS) ×3 IMPLANT
PAD WRAPON POLAR KNEE (MISCELLANEOUS) ×1 IMPLANT
PULSAVAC PLUS IRRIG FAN TIP (DISPOSABLE) ×3
STAPLER SKIN PROX 35W (STAPLE) ×3 IMPLANT
SUCTION FRAZIER HANDLE 10FR (MISCELLANEOUS) ×2
SUCTION TUBE FRAZIER 10FR DISP (MISCELLANEOUS) ×1 IMPLANT
SUT DVC 2 QUILL PDO  T11 36X36 (SUTURE) ×2
SUT DVC 2 QUILL PDO T11 36X36 (SUTURE) ×1 IMPLANT
SUT VIC AB 2-0 CT1 18 (SUTURE) ×3 IMPLANT
SUT VIC AB 2-0 CT1 27 (SUTURE)
SUT VIC AB 2-0 CT1 TAPERPNT 27 (SUTURE) IMPLANT
SUT VIC AB PLUS 45CM 1-MO-4 (SUTURE) ×3 IMPLANT
SYR 30ML LL (SYRINGE) ×9 IMPLANT
TIP FAN IRRIG PULSAVAC PLUS (DISPOSABLE) ×1 IMPLANT
WRAPON POLAR PAD KNEE (MISCELLANEOUS) ×3

## 2019-10-11 NOTE — Anesthesia Procedure Notes (Signed)
Spinal  Patient location during procedure: OR Start time: 10/11/2019 11:30 AM End time: 10/11/2019 12:00 PM Staffing Performed: anesthesiologist  Anesthesiologist: Arita Miss, MD Preanesthetic Checklist Completed: patient identified, IV checked, site marked, risks and benefits discussed, surgical consent, monitors and equipment checked, pre-op evaluation and timeout performed Spinal Block Patient position: sitting Prep: ChloraPrep Patient monitoring: heart rate, continuous pulse ox, blood pressure and cardiac monitor Approach: midline Location: L3-4 Injection technique: single-shot Needle Needle type: Quincke  Needle gauge: 22 G Needle length: 9 cm Assessment Sensory level: T10 Additional Notes Negative paresthesia. Negative blood return. Positive free-flowing CSF. Expiration date of kit checked and confirmed. Patient tolerated procedure well, without complications.

## 2019-10-11 NOTE — Anesthesia Procedure Notes (Signed)
Anesthesia Regional Block: Adductor canal block   Pre-Anesthetic Checklist: ,, timeout performed, Correct Patient, Correct Site, Correct Laterality, Correct Procedure, Correct Position, site marked, Risks and benefits discussed,  Surgical consent,  Pre-op evaluation,  At surgeon's request and post-op pain management  Laterality: Lower and Left  Prep: chloraprep       Needles:  Injection technique: Single-shot  Needle Type: Echogenic Needle     Needle Length: 9cm  Needle Gauge: 21     Additional Needles:   Procedures:,,,, ultrasound used (permanent image in chart),,,,  Narrative:  Start time: 10/11/2019 10:33 AM End time: 10/11/2019 10:39 AM Injection made incrementally with aspirations every 5 mL.  Performed by: Personally  Anesthesiologist: Corinda Gubler, MD  Additional Notes: Patient consented for risk and benefits of nerve block including but not limited to nerve damage, failed block, bleeding and infection.  Patient voiced understanding.  Functioning IV was confirmed and monitors were applied.  A echogenic needle was used. Sterile prep,hand hygiene and sterile gloves were used. Minimal sedation used for procedure.   No paresthesia endorsed by patient during the procedure.  Negative aspiration and negative test dose prior to incremental administration of local anesthetic. The patient tolerated the procedure well with no immediate complications.

## 2019-10-11 NOTE — Anesthesia Postprocedure Evaluation (Signed)
Anesthesia Post Note  Patient: Denise Norris  Procedure(s) Performed: TOTAL KNEE ARTHROPLASTY (Left Knee)  Patient location during evaluation: PACU Anesthesia Type: Combined General/Spinal Level of consciousness: oriented and awake and alert Pain management: pain level controlled Vital Signs Assessment: post-procedure vital signs reviewed and stable Respiratory status: spontaneous breathing, respiratory function stable and patient connected to nasal cannula oxygen Cardiovascular status: blood pressure returned to baseline and stable Postop Assessment: no headache, no backache and no apparent nausea or vomiting Anesthetic complications: no   No complications documented.   Last Vitals:  Vitals:   10/11/19 1518 10/11/19 1547  BP: (!) 167/75 (!) 172/72  Pulse: (!) 56 (!) 57  Resp: 13 18  Temp:  36.7 C  SpO2: 98% 100%    Last Pain:  Vitals:   10/11/19 1547  TempSrc: Oral  PainSc:                  Corinda Gubler

## 2019-10-11 NOTE — Op Note (Signed)
DATE OF SURGERY:  10/11/2019 TIME: 1:03 PM  PATIENT NAME:  Denise Norris   AGE: 69 y.o.    PRE-OPERATIVE DIAGNOSIS:  M17.12 Unilateral primary osteoarthritis, left knee  POST-OPERATIVE DIAGNOSIS:  Same  PROCEDURE:  Procedure(s): TOTAL KNEE ARTHROPLASTY, left  SURGEON:  Lovell Sheehan, MD   ASSISTANT:  Carlynn Spry, PA-C  OPERATIVE IMPLANTS: Tamala [employer]hew, Cruciate Retaining Oxinium Femoral component size 4 narrow, Fixed Bearing Tray size 3, Patella polyethylene 3-peg oval button size 29 mm, with a 11 mm HighFlex insert.   PREOPERATIVE INDICATIONS:  [name] is an 69 y.o. female who has a diagnosis of M17.12 Unilateral primary osteoarthritis, left knee and elected for a left total knee arthroplasty after failing nonoperative treatment, including activity modification, pain medication, physical therapy and injections who has significant impairment of their activities of daily living.  Radiographs have demonstrated tricompartmental osteoarthritis joint space narrowing, osteophytes, subchondral sclerosis and cyst formation.  The risks, benefits, and alternatives were discussed at length including but not limited to the risks of infection, bleeding, nerve or blood vessel injury, knee stiffness, fracture, dislocation, loosening or failure of the hardware and the need for further surgery. Medical risks include but not limited to DVT and pulmonary embolism, myocardial infarction, stroke, pneumonia, respiratory failure and death. I discussed these risks with the patient in my office prior to the date of surgery. They understood these risks and were willing to proceed.  OPERATIVE FINDINGS AND UNIQUE ASPECTS OF THE CASE:  All three compartments with advanced and severe degenerative changes, large osteophytes and an abundance of synovial fluid. Significant deformity was also noted. A decision was made to proceed with total knee arthroplasty.   OPERATIVE DESCRIPTION:  The patient was brought  to the operative room and placed in a supine position after undergoing placement of a general anesthetic. IV antibiotics were given. Patient received tranexamic acid. The lower extremity was prepped and draped in the usual sterile fashion.  A time out was performed to verify the patient's name, date of birth, medical record number, correct site of surgery and correct procedure to be performed. The timeout was also used to confirm the patient received antibiotics and that appropriate instruments, implants and radiographs studies were available in the room.  The leg was elevated and exsanguinated with an Esmarch and the tourniquet was inflated to 250 mmHg.  A midline incision was made over the left knee.. A medial parapatellar arthrotomy was then made and the patella subluxed laterally and the knee was brought into 90 of flexion. Hoffa's fat pad along with the anterior cruciate ligament was resected and the medial joint line was exposed.  Attention was then turned to preparation of the patella. The thickness of the patella was measured with a caliper, the diameter measured with the patella templates.  The patella resection was then made with an oscillating saw using the patella cutting guide.  The 29 mm button fit appropriately.  3 peg holes for the patella component were then drilled.  The extramedullary tibial cutting guide was then placed using the anterior tibial crest and second ray of the foot as a reference.  The tibial cutting guide was adjusted to allow for appropriate posterior slope.  The tibial cutting block was pinned into position. The slotted stylus was used to measure the proximal tibial resection of 9 mm off the high lateral side. Care was taken during the tibial resection to protect the medial and collateral ligaments.  The resected tibial bone was removed.  The distal femur was resected using the Visionaire cutting guide.  Care was taken to protect the collateral ligaments during distal  femoral resection.  The distal femoral resection was performed with an oscillating saw. The femoral cutting guide was then removed. Extension gap was measured with a 11 mm spacer block and alignment and extension was confirmed using a long alignment rod. The femur was sized to be a 4 narrow. Rotation of the referencing guide was checked with the epicondylar axis and Whitesides line. Then the 4-in-1 cutting jig was then applied to the distal femur. A stylus was used to confirm that the anterior femur would not be notched.   Then the anterior, posterior and chamfer femoral cuts were then made with an oscillating saw.  The knee was distracted and all posterior osteophytes were removed.  The flexion gap was then measured with a flexion spacer block and long alignment rod and was found to be symmetric with the extension gap and perpendicular to mechanical axis of the tibia.  The proximal tibia plateau was then sized with trial trays. The best coverage was achieved with a size 3. This tibial tray was then pinned into position. The proximal tibia was then prepared with the keel punch.  After tibial preparation was completed, all trial components were inserted with polyethylene trials. The knee achieved full extension and flexed to 120 degrees. Ligament were stable to varus and valgus at full extension as well as 30, 60 and 90 degrees of flexion.   The trials were then placed. Knee was taken through a full range of motion and deemed to be stable with the trial components. All trial components were then removed.  The joint was copiously irrigated with pulse lavage.  The final total knee arthroplasty components were then cemented into place. The knee was held in extension while cement was allowed to cure.The knee was taken through a range of motion and the patella tracked well and the knee was again irrigated copiously.  The knee capsule was then injected with Exparel.  The medial arthrotomy was closed with #1 Vicryl  and #2 Quill. The subcutaneous tissue closed with  2-0 vicryl, and skin approximated with staples.  A dry sterile and compressive dressing was applied.  A Polar Care was applied to the operative knee.  The patient was awakened and brought to the PACU in stable and satisfactory condition.  All sharp, lap and instrument counts were correct at the conclusion the case. I spoke with the patient's family in the postop consultation room to let them know the case had been performed without complication and the patient was stable in recovery room.   Total tourniquet time was 48 minutes.

## 2019-10-11 NOTE — H&P (Signed)
The patient has been re-examined, and the chart reviewed, and there have been no interval changes to the documented history and physical.  Plan a left total knee today.  Anesthesia is consulted regarding a peripheral nerve block for post-operative pain.  The risks, benefits, and alternatives have been discussed at length, and the patient is willing to proceed.     

## 2019-10-11 NOTE — Anesthesia Preprocedure Evaluation (Addendum)
Anesthesia Evaluation  Patient identified by MRN, date of birth, ID band Patient awake    Reviewed: Allergy & Precautions, NPO status , Patient's Chart, lab work & pertinent test results  History of Anesthesia Complications Negative for: history of anesthetic complications  Airway Mallampati: III  TM Distance: >3 FB Neck ROM: Full    Dental no notable dental hx. (+) Teeth Intact   Pulmonary neg pulmonary ROS, neg sleep apnea, neg COPD, Patient abstained from smoking.Not current smoker,    Pulmonary exam normal breath sounds clear to auscultation       Cardiovascular Exercise Tolerance: Good METS(-) hypertension(-) CAD and (-) Past MI negative cardio ROS  (-) dysrhythmias  Rhythm:Regular Rate:Normal - Systolic murmurs    Neuro/Psych  Headaches, negative psych ROS   GI/Hepatic GERD  Controlled,(+)     (-) substance abuse  ,   Endo/Other  neg diabetes  Renal/GU negative Renal ROS     Musculoskeletal  (+) Arthritis ,   Abdominal   Peds  Hematology   Anesthesia Other Findings Past Medical History: No date: Arthritis No date: Connective tissue disorder ([facility])     Comment:  Unspecified, previously followed by [facility]. No date: GERD (gastroesophageal reflux disease) No date: Glaucoma No date: Migraine  Reproductive/Obstetrics                             Anesthesia Physical Anesthesia Plan  ASA: II  Anesthesia Plan: General/Spinal   Post-op Pain Management:  Regional for Post-op pain   Induction: Intravenous  PONV Risk Score and Plan: 3 and Ondansetron, Dexamethasone, Propofol infusion, TIVA and Midazolam  Airway Management Planned: Natural Airway  Additional Equipment: None  Intra-op Plan:   Post-operative Plan:   Informed Consent: I have reviewed the patients History and Physical, chart, labs and discussed the procedure including the risks, benefits and alternatives for  the proposed anesthesia with the patient or authorized representative who has indicated his/her understanding and acceptance.       Plan Discussed with: CRNA and Surgeon  Anesthesia Plan Comments: (Discussed r/b/a of adductor canal nerve block, including:  - bleeding, infection, nerve damage - poor or non functioning block. Patient understands.   Discussed R/B/A of neuraxial anesthesia technique with patient: - rare risks of spinal/epidural hematoma, nerve damage, infection - Risk of PDPH - Risk of nausea and vomiting - Risk of conversion to general anesthesia and its associated risks, including sore throat, damage to lips/teeth/oropharynx, and rare risks such as cardiac and respiratory events.  Patient voiced understanding.)       Anesthesia Quick Evaluation

## 2019-10-11 NOTE — Transfer of Care (Signed)
Immediate Anesthesia Transfer of Care Note  Patient: Denise Norris  Procedure(s) Performed: TOTAL KNEE ARTHROPLASTY (Left Knee)  Patient Location: PACU  Anesthesia Type:General  Level of Consciousness: awake, alert , oriented and patient cooperative  Airway & Oxygen Therapy: Patient Spontanous Breathing and Patient connected to face mask oxygen  Post-op Assessment: Report given to RN and Post -op Vital signs reviewed and stable  Post vital signs: Reviewed and stable  Last Vitals:  Vitals Value Taken Time  BP 135/76 10/11/19 1317  Temp    Pulse 69 10/11/19 1320  Resp 15 10/11/19 1320  SpO2 100 % 10/11/19 1320  Vitals shown include unvalidated device data.  Last Pain:  Vitals:   10/11/19 1045  TempSrc:   PainSc: 2       Patients Stated Pain Goal: 0 (10/11/19 1012)  Complications: No complications documented.

## 2019-10-12 ENCOUNTER — Encounter: Payer: Self-pay | Admitting: Orthopedic Surgery

## 2019-10-12 DIAGNOSIS — M1712 Unilateral primary osteoarthritis, left knee: Secondary | ICD-10-CM | POA: Diagnosis not present

## 2019-10-12 DIAGNOSIS — Z96659 Presence of unspecified artificial knee joint: Secondary | ICD-10-CM

## 2019-10-12 LAB — CBC
HCT: 30.5 % — ABNORMAL LOW (ref 36.0–46.0)
Hemoglobin: 10.7 g/dL — ABNORMAL LOW (ref 12.0–15.0)
MCH: 31.2 pg (ref 26.0–34.0)
MCHC: 35.1 g/dL (ref 30.0–36.0)
MCV: 88.9 fL (ref 80.0–100.0)
Platelets: 189 10*3/uL (ref 150–400)
RBC: 3.43 MIL/uL — ABNORMAL LOW (ref 3.87–5.11)
RDW: 13.3 % (ref 11.5–15.5)
WBC: 9.9 10*3/uL (ref 4.0–10.5)
nRBC: 0 % (ref 0.0–0.2)

## 2019-10-12 LAB — BASIC METABOLIC PANEL
Anion gap: 9 (ref 5–15)
BUN: 23 mg/dL (ref 8–23)
CO2: 25 mmol/L (ref 22–32)
Calcium: 8.6 mg/dL — ABNORMAL LOW (ref 8.9–10.3)
Chloride: 104 mmol/L (ref 98–111)
Creatinine, Ser: 0.63 mg/dL (ref 0.44–1.00)
GFR calc Af Amer: >60 mL/min (ref >60)
GFR calc non Af Amer: >60 mL/min (ref >60)
Glucose, Bld: 104 mg/dL — ABNORMAL HIGH (ref 70–99)
Potassium: 4.1 mmol/L (ref 3.5–5.1)
Sodium: 138 mmol/L (ref 135–145)

## 2019-10-12 MED ORDER — DOCUSATE SODIUM 100 MG PO CAPS: 100.0000 mg | ORAL_CAPSULE | Freq: Two times a day (BID) | ORAL | 0 refills | Status: DC

## 2019-10-12 MED ORDER — RIVAROXABAN 10 MG PO TABS: 10.0000 mg | ORAL_TABLET | Freq: Every day | ORAL | 0 refills | Status: DC

## 2019-10-12 MED ORDER — ONDANSETRON HCL 4 MG PO TABS: 4.0000 mg | ORAL_TABLET | Freq: Four times a day (QID) | ORAL | 0 refills | Status: DC | PRN

## 2019-10-12 MED ORDER — HYDROCODONE-ACETAMINOPHEN 7.5-325 MG PO TABS: 1.0000 | ORAL_TABLET | ORAL | 0 refills | Status: DC | PRN

## 2019-10-12 MED ORDER — METHOCARBAMOL 500 MG PO TABS: 500.0000 mg | ORAL_TABLET | Freq: Four times a day (QID) | ORAL | 0 refills | Status: DC

## 2019-10-12 NOTE — Plan of Care (Signed)

## 2019-10-12 NOTE — Discharge Instructions (Signed)
Continue weight bear as tolerated on the left lower extremity.    Elevate the left lower extremity whenever possible and continue the polar care while elevating the extremity. Patient may shower. No bath or submerging the wound.    Take Xarelto as directed for blood clot prevention.  Continue to work on knee range of motion exercises at home as instructed by physical therapy. Continue to use a walker for assistance with ambulation until cleared by physical therapy.  Call [phone] with any questions, such as fever > 101.5 degrees, drainage from the wound or shortness of breath.  

## 2019-10-12 NOTE — Evaluation (Signed)
Physical Therapy Evaluation Patient Details Name: KENNETH LAX MRN: [id number] DOB: 09/23/1950 Today's Date: 10/12/2019   History of Present Illness  69 y/o female s/p L TKA 6/16, has successful R TKA in October.  Clinical Impression  Pt seen for first PT session post-op day 1.  She did very well and showed good safety and confidence with all mobility, exercises and showed good understanding of positioning, etc.  She was able to easily do SLRs, had nearly 100 degrees of AROM flexion, was able to circumambulate the nurses' station with consistent and safe cadence and overall did very well.  She will have 24/7 family assist in when she returns home, safe to do so from a PT stand-point once medically clear.     Follow Up Recommendations Outpatient PT;Follow surgeon's recommendation for DC plan and follow-up therapies    Equipment Recommendations  None recommended by PT    Recommendations for Other Services       Precautions / Restrictions Precautions Precautions: Fall;Knee Restrictions Weight Bearing Restrictions: Yes LLE Weight Bearing: Weight bearing as tolerated      Mobility  Bed Mobility Overal bed mobility: Modified Independent             General bed mobility comments: easily transitions to EOB w/o assist  Transfers Overall transfer level: Modified independent Equipment used: Rolling walker (2 wheeled)             General transfer comment: Pt was able to rise to standing and maintain balance with walker safely  Ambulation/Gait Ambulation/Gait assistance: Supervision Gait Distance (Feet): 200 Feet Assistive device: Rolling walker (2 wheeled)       General Gait Details: Pt was able to quickly attain consistent walker motion and cadence.  She showed very little hesitancy, vitals remained stable and WNL, overall great first bout of ambulation post sx   Stairs            Wheelchair Mobility    Modified Rankin (Stroke Patients Only)        Balance Overall balance assessment: Modified Independent                                           Pertinent Vitals/Pain Pain Assessment: 0-10 Pain Score: 3     Home Living Family/patient expects to be discharged to:: Private residence Living Arrangements: Alone Available Help at Discharge: Family (son and d-in-law are going to be staying with her initially) Type of Home: House Home Access: Level entry       Home Equipment: Walker - 2 wheels;Cane - single point;Shower seat      Prior Function Level of Independence: Independent         Comments: Pt independent with all mobility, able to be active     Hand Dominance        Extremity/Trunk Assessment   Upper Extremity Assessment Upper Extremity Assessment: Overall WFL for tasks assessed    Lower Extremity Assessment Lower Extremity Assessment: Overall WFL for tasks assessed (very good POD1 AROM on L with good QofM)       Communication   Communication: No difficulties  Cognition Arousal/Alertness: Awake/alert Behavior During Therapy: WFL for tasks assessed/performed Overall Cognitive Status: Within Functional Limits for tasks assessed  General Comments      Exercises Total Joint Exercises Ankle Circles/Pumps: AROM;10 reps Quad Sets: Strengthening;10 reps Heel Slides: Strengthening;10 reps (with resisted leg extensions) Hip ABduction/ADduction: Strengthening;10 reps Straight Leg Raises: AROM;10 reps Knee Flexion: AROM;10 reps Goniometric ROM: 0-101 (AROM to 97)   Assessment/Plan    PT Assessment Patient needs continued PT services  PT Problem List Decreased strength;Decreased range of motion;Decreased activity tolerance;Decreased balance;Decreased mobility;Decreased knowledge of use of DME;Decreased safety awareness;Pain       PT Treatment Interventions DME instruction;Gait training;Functional mobility training;Therapeutic  activities;Therapeutic exercise;Stair training;Balance training;Neuromuscular re-education;Patient/family education    PT Goals (Current goals can be found in the Care Plan section)  Acute Rehab PT Goals Patient Stated Goal: go home PT Goal Formulation: With patient Time For Goal Achievement: 10/26/19 Potential to Achieve Goals: Good    Frequency BID   Barriers to discharge        Co-evaluation               AM-PAC PT "6 Clicks" Mobility  Outcome Measure Help needed turning from your back to your side while in a flat bed without using bedrails?: None Help needed moving from lying on your back to sitting on the side of a flat bed without using bedrails?: None Help needed moving to and from a bed to a chair (including a wheelchair)?: None Help needed standing up from a chair using your arms (e.g., wheelchair or bedside chair)?: None Help needed to walk in hospital room?: None Help needed climbing 3-5 steps with a railing? : A Little 6 Click Score: 23    End of Session Equipment Utilized During Treatment: Gait belt Activity Tolerance: Patient tolerated treatment well Patient left: with chair alarm set;with call bell/phone within reach Nurse Communication: Mobility status PT Visit Diagnosis: Other abnormalities of gait and mobility (R26.89);Muscle weakness (generalized) (M62.81)    Time: 2035-5974 PT Time Calculation (min) (ACUTE ONLY): 31 min   Charges:   PT Evaluation $PT Eval Low Complexity: 1 Low PT Treatments $Gait Training: 8-22 mins $Therapeutic Exercise: 8-22 mins        Kreg Shropshire, DPT 10/12/2019, 9:45 AM

## 2019-10-12 NOTE — TOC Transition Note (Signed)
Transition of Care [facility]) - CM/SW Discharge Note   Patient Details  Name: Denise Norris MRN: [id number] Date of Birth: 1951/04/20  [facility] CM/SW Contact:  Su Hilt, RN Phone Number: 10/12/2019, 9:25 AM   Clinical Narrative:    Met with the patient to discuss DC plan and needs She will be staying with her son and daughter in law She has the DME from the previous knee surgery She has transportation with her Son and daughter in law, NO additional needs   Final next level of care: OP Rehab Barriers to Discharge: Barriers Resolved   Patient Goals and CMS Choice Patient states their goals for this hospitalization and ongoing recovery are:: go home      Discharge Placement                       Discharge Plan and Services   Discharge Planning Services: CM Consult            DME Arranged: N/A         HH Arranged: NA          Social Determinants of Health (SDOH) Interventions     Readmission Risk Interventions No flowsheet data found.

## 2019-10-12 NOTE — Plan of Care (Signed)

## 2019-10-12 NOTE — Plan of Care (Signed)
  Problem: Education: Goal: Knowledge of General Education information will improve Description: Including pain rating scale, medication(s)/side effects and non-pharmacologic comfort measures 10/12/2019 1208 by Meyer Cory, RN Outcome: Adequate for Discharge 10/12/2019 1208 by Meyer Cory, RN Outcome: Progressing   Problem: Health Behavior/Discharge Planning: Goal: Ability to manage health-related needs will improve 10/12/2019 1208 by Meyer Cory, RN Outcome: Adequate for Discharge 10/12/2019 1208 by Meyer Cory, RN Outcome: Progressing   Problem: Clinical Measurements: Goal: Ability to maintain clinical measurements within normal limits will improve 10/12/2019 1208 by Meyer Cory, RN Outcome: Adequate for Discharge 10/12/2019 1208 by Meyer Cory, RN Outcome: Progressing Goal: Will remain free from infection 10/12/2019 1208 by Meyer Cory, RN Outcome: Adequate for Discharge 10/12/2019 1208 by Meyer Cory, RN Outcome: Progressing Goal: Diagnostic test results will improve 10/12/2019 1208 by Meyer Cory, RN Outcome: Adequate for Discharge 10/12/2019 1208 by Meyer Cory, RN Outcome: Adequate for Discharge Goal: Respiratory complications will improve 10/12/2019 1208 by Meyer Cory, RN Outcome: Adequate for Discharge 10/12/2019 1208 by Meyer Cory, RN Outcome: Adequate for Discharge Goal: Cardiovascular complication will be avoided 10/12/2019 1208 by Meyer Cory, RN Outcome: Adequate for Discharge 10/12/2019 1208 by Meyer Cory, RN Outcome: Progressing   Problem: Activity: Goal: Risk for activity intolerance will decrease 10/12/2019 1208 by Meyer Cory, RN Outcome: Adequate for Discharge 10/12/2019 1208 by Meyer Cory, RN Outcome: Progressing   Problem: Nutrition: Goal: Adequate nutrition will be maintained 10/12/2019 1208 by Meyer Cory, RN Outcome: Adequate for Discharge 10/12/2019 1208 by Meyer Cory, RN Outcome: Progressing    Problem: Coping: Goal: Level of anxiety will decrease 10/12/2019 1208 by Meyer Cory, RN Outcome: Adequate for Discharge 10/12/2019 1208 by Meyer Cory, RN Outcome: Progressing   Problem: Elimination: Goal: Will not experience complications related to bowel motility 10/12/2019 1208 by Meyer Cory, RN Outcome: Adequate for Discharge 10/12/2019 1208 by Meyer Cory, RN Outcome: Progressing Goal: Will not experience complications related to urinary retention 10/12/2019 1208 by Meyer Cory, RN Outcome: Adequate for Discharge 10/12/2019 1208 by Meyer Cory, RN Outcome: Progressing   Problem: Pain Managment: Goal: General experience of comfort will improve 10/12/2019 1208 by Meyer Cory, RN Outcome: Adequate for Discharge 10/12/2019 1208 by Meyer Cory, RN Outcome: Progressing   Problem: Safety: Goal: Ability to remain free from injury will improve 10/12/2019 1208 by Meyer Cory, RN Outcome: Adequate for Discharge 10/12/2019 1208 by Meyer Cory, RN Outcome: Progressing   Problem: Skin Integrity: Goal: Risk for impaired skin integrity will decrease 10/12/2019 1208 by Meyer Cory, RN Outcome: Adequate for Discharge 10/12/2019 1208 by Meyer Cory, RN Outcome: Progressing

## 2019-10-12 NOTE — Care Management Obs Status (Signed)
MEDICARE OBSERVATION STATUS NOTIFICATION   Patient Details  Name: CORIANA ANGELLO MRN: [id number] Date of Birth: 08/21/1950   Medicare Observation Status Notification Given:  Yes    Grace Haggart Clementeen Hoof, RN 10/12/2019, 12:05 PM

## 2019-10-12 NOTE — Discharge Summary (Signed)
Physician Discharge Summary  Patient ID: Denise Norris MRN: [id number] DOB/AGE: 1950-11-25 69 y.o.  Admit date: 10/11/2019 Discharge date: 10/12/2019  Admission Diagnoses:  M17.12 Unilateral primary osteoarthritis, left knee <principal problem not specified>  Discharge Diagnoses:  M17.12 Unilateral primary osteoarthritis, left knee Active Problems:   S/P TKR (total knee replacement) using cement, left   Past Medical History:  Diagnosis Date  . Arthritis   . Connective tissue disorder (HCC)    Unspecified, previously followed by [facility].  Marland Kitchen GERD (gastroesophageal reflux disease)   . Glaucoma   . Migraine     Surgeries: Procedure(s): TOTAL KNEE ARTHROPLASTY on 10/11/2019   Consultants (if any):   Discharged Condition: Improved  Hospital Course: Denise Norris is an 69 y.o. female who was admitted 10/11/2019 with a diagnosis of  M17.12 Unilateral primary osteoarthritis, left knee <principal problem not specified> and went to the operating room on 10/11/2019 and underwent the above named procedures.    She was given perioperative antibiotics:  Anti-infectives (From admission, onward)   Start     Dose/Rate Route Frequency Ordered Stop   10/11/19 1800  ceFAZolin (ANCEF) IVPB 2g/100 mL premix        2 g 200 mL/hr over 30 Minutes Intravenous Every 6 hours 10/11/19 1624 10/12/19 0037   10/11/19 1209  50,000 units bacitracin in 0.9% normal saline 250 mL irrigation  Status:  Discontinued          As needed 10/11/19 1209 10/11/19 1312   10/11/19 1027  ceFAZolin (ANCEF) 2-4 GM/100ML-% IVPB       Note to Pharmacy: Register, Karen   : cabinet override      10/11/19 1027 10/11/19 1152   10/11/19 0600  ceFAZolin (ANCEF) IVPB 2g/100 mL premix        2 g 200 mL/hr over 30 Minutes Intravenous On call to O.R. 10/11/19 0155 10/11/19 1210    .  She was given sequential compression devices, early ambulation, and Xarelto for DVT prophylaxis.  She benefited maximally from the hospital stay  and there were no complications.    Recent vital signs:  Vitals:   10/12/19 0023 10/12/19 0402  BP: 114/80 120/89  Pulse: (!) 52 64  Resp: 17 16  Temp: 98.2 F (36.8 C)   SpO2: 98% 100%    Recent laboratory studies:  Lab Results  Component Value Date   HGB 10.7 (L) 10/12/2019   HGB 12.9 09/22/2019   HGB 9.4 (L) 02/03/2019   Lab Results  Component Value Date   WBC 9.9 10/12/2019   PLT 189 10/12/2019   Lab Results  Component Value Date   INR 1.0 10/03/2019   Lab Results  Component Value Date   NA 137 09/22/2019   K 4.5 09/22/2019   CL 102 09/22/2019   CO2 29 09/22/2019   BUN 22 09/22/2019   CREATININE 0.63 09/22/2019   GLUCOSE 87 09/22/2019    Discharge Medications:   Allergies as of 10/12/2019      Reactions   Codeine Hives      Medication List    TAKE these medications   acetaminophen 500 MG tablet Commonly known as: TYLENOL Take 1,000 mg by mouth in the morning and at bedtime.   docusate sodium 100 MG capsule Commonly known as: COLACE Take 1 capsule (100 mg total) by mouth 2 (two) times daily.   famotidine 20 MG tablet Commonly known as: PEPCID Take 20 mg by mouth 2 (two) times daily as needed for heartburn  or indigestion.   Fish Oil Triple Strength 1400 MG Caps Take 1,400 mg by mouth in the morning and at bedtime.   HYDROcodone-acetaminophen 7.5-325 MG tablet Commonly known as: NORCO Take 1 tablet by mouth every 4 (four) hours as needed for moderate pain (pain score 7-10).   methocarbamol 500 MG tablet Commonly known as: Robaxin Take 1 tablet (500 mg total) by mouth 4 (four) times daily.   ondansetron 4 MG tablet Commonly known as: ZOFRAN Take 1 tablet (4 mg total) by mouth every 6 (six) hours as needed for nausea.   rivaroxaban 10 MG Tabs tablet Commonly known as: XARELTO Take 1 tablet (10 mg total) by mouth daily with breakfast.   timolol 0.5 % ophthalmic solution Commonly known as: TIMOPTIC Place 1 drop into both eyes 2 (two) times  daily.   TURMERIC PO Take 667 mg by mouth in the morning and at bedtime.            Durable Medical Equipment  (From admission, onward)         Start     Ordered   10/12/19 0703  For home use only DME 3 n 1  Once        10/12/19 0702   10/12/19 0703  For home use only DME Walker rolling  Once       Question Answer Comment  Walker: With 5 Inch Wheels   Patient needs a walker to treat with the following condition Osteoarthritis of left knee      10/12/19 0702          Diagnostic Studies: DG Knee Left Port  Result Date: 10/11/2019 CLINICAL DATA:  Status post left total knee arthroplasty. EXAM: PORTABLE LEFT KNEE - 1-2 VIEW COMPARISON:  None. FINDINGS: The left femoral and tibial components appear to be well situated. Expected postoperative changes are noted in the soft tissues anteriorly. IMPRESSION: Status post left total knee arthroplasty. Electronically Signed   By: Marijo Conception M.D.   On: 10/11/2019 15:41   [place] OR NERVE BLOCK-IMAGE ONLY [facility])  Result Date: 10/11/2019 There is no interpretation for this exam.  This order is for images obtained during a surgical procedure.  Please See "Surgeries" Tab for more information regarding the procedure.    Disposition: Discharge disposition: 01-Home or Self Care      Follow up in 2 weeks for staple removal Call [phone] to confirm appointment      Signed: Carlynn Spry ,PA-C 10/12/2019, 7:03 AM

## 2019-10-12 NOTE — Progress Notes (Signed)
  Subjective:  Patient reports pain as mild.  Hypertension resolving  Objective:   VITALS:   Vitals:   10/11/19 1830 10/11/19 1952 10/12/19 0023 10/12/19 0402  BP: (!) 155/76 133/78 114/80 120/89  Pulse: (!) 56 71 (!) 52 64  Resp: _0 Temp: 98.2 F (36.8 C) 98.4 F (36.9 C) 98.2 F (36.8 C)   TempSrc: Oral Oral    SpO2: 100% 100% 98% 100%  Weight:      Height:        PHYSICAL EXAM:  Neurologically intact ABD soft Neurovascular intact Sensation intact distally Intact pulses distally Dorsiflexion/Plantar flexion intact Incision: dressing C/D/I No cellulitis present Compartment soft  LABS  No results found for this or any previous visit (from the past 24 hour(s)).  DG Knee Left Port  Result Date: 10/11/2019 CLINICAL DATA:  Status post left total knee arthroplasty. EXAM: PORTABLE LEFT KNEE - 1-2 VIEW COMPARISON:  None. FINDINGS: The left femoral and tibial components appear to be well situated. Expected postoperative changes are noted in the soft tissues anteriorly. IMPRESSION: Status post left total knee arthroplasty. Electronically Signed   By: Marijo Conception M.D.   On: 10/11/2019 15:41   [place] OR NERVE BLOCK-IMAGE ONLY [facility])  Result Date: 10/11/2019 There is no interpretation for this exam.  This order is for images obtained during a surgical procedure.  Please See "Surgeries" Tab for more information regarding the procedure.    Assessment/Plan: 1 Day Post-Op   Active Problems:   S/P TKR (total knee replacement) using cement, left   Advance diet Up with therapy  Discharge today if PT goals met   Carlynn Spry , PA-C 10/12/2019, 6:57 AM

## 2019-10-16 ENCOUNTER — Other Ambulatory Visit: Payer: Medicare Other

## 2019-10-20 DIAGNOSIS — Z96659 Presence of unspecified artificial knee joint: Secondary | ICD-10-CM | POA: Diagnosis not present

## 2019-10-20 DIAGNOSIS — M545 Low back pain: Secondary | ICD-10-CM | POA: Diagnosis not present

## 2019-10-25 DIAGNOSIS — Z96659 Presence of unspecified artificial knee joint: Secondary | ICD-10-CM | POA: Diagnosis not present

## 2019-10-25 DIAGNOSIS — M545 Low back pain: Secondary | ICD-10-CM | POA: Diagnosis not present

## 2019-10-27 DIAGNOSIS — Z96659 Presence of unspecified artificial knee joint: Secondary | ICD-10-CM | POA: Diagnosis not present

## 2019-10-27 DIAGNOSIS — M545 Low back pain: Secondary | ICD-10-CM | POA: Diagnosis not present

## 2019-11-01 DIAGNOSIS — M545 Low back pain: Secondary | ICD-10-CM | POA: Diagnosis not present

## 2019-11-01 DIAGNOSIS — Z96659 Presence of unspecified artificial knee joint: Secondary | ICD-10-CM | POA: Diagnosis not present

## 2019-11-03 DIAGNOSIS — M545 Low back pain: Secondary | ICD-10-CM | POA: Diagnosis not present

## 2019-11-03 DIAGNOSIS — Z96659 Presence of unspecified artificial knee joint: Secondary | ICD-10-CM | POA: Diagnosis not present

## 2019-11-08 DIAGNOSIS — M545 Low back pain: Secondary | ICD-10-CM | POA: Diagnosis not present

## 2019-11-08 DIAGNOSIS — Z96659 Presence of unspecified artificial knee joint: Secondary | ICD-10-CM | POA: Diagnosis not present

## 2019-11-10 DIAGNOSIS — M545 Low back pain: Secondary | ICD-10-CM | POA: Diagnosis not present

## 2019-11-10 DIAGNOSIS — Z96659 Presence of unspecified artificial knee joint: Secondary | ICD-10-CM | POA: Diagnosis not present

## 2019-11-13 DIAGNOSIS — M545 Low back pain: Secondary | ICD-10-CM | POA: Diagnosis not present

## 2019-11-13 DIAGNOSIS — Z96659 Presence of unspecified artificial knee joint: Secondary | ICD-10-CM | POA: Diagnosis not present

## 2019-11-13 DIAGNOSIS — Z23 Encounter for immunization: Secondary | ICD-10-CM | POA: Diagnosis not present

## 2019-11-17 DIAGNOSIS — Z96659 Presence of unspecified artificial knee joint: Secondary | ICD-10-CM | POA: Diagnosis not present

## 2019-11-17 DIAGNOSIS — M545 Low back pain: Secondary | ICD-10-CM | POA: Diagnosis not present

## 2019-11-22 DIAGNOSIS — Z96659 Presence of unspecified artificial knee joint: Secondary | ICD-10-CM | POA: Diagnosis not present

## 2019-11-22 DIAGNOSIS — M545 Low back pain: Secondary | ICD-10-CM | POA: Diagnosis not present

## 2019-11-23 DIAGNOSIS — Z09 Encounter for follow-up examination after completed treatment for conditions other than malignant neoplasm: Secondary | ICD-10-CM | POA: Diagnosis not present

## 2019-11-24 DIAGNOSIS — M545 Low back pain: Secondary | ICD-10-CM | POA: Diagnosis not present

## 2019-11-24 DIAGNOSIS — Z96659 Presence of unspecified artificial knee joint: Secondary | ICD-10-CM | POA: Diagnosis not present

## 2019-11-27 DIAGNOSIS — M545 Low back pain: Secondary | ICD-10-CM | POA: Diagnosis not present

## 2019-11-27 DIAGNOSIS — Z96659 Presence of unspecified artificial knee joint: Secondary | ICD-10-CM | POA: Diagnosis not present

## 2019-11-29 DIAGNOSIS — Z96659 Presence of unspecified artificial knee joint: Secondary | ICD-10-CM | POA: Diagnosis not present

## 2019-11-29 DIAGNOSIS — M545 Low back pain: Secondary | ICD-10-CM | POA: Diagnosis not present

## 2019-12-06 DIAGNOSIS — Z96659 Presence of unspecified artificial knee joint: Secondary | ICD-10-CM | POA: Diagnosis not present

## 2019-12-06 DIAGNOSIS — M545 Low back pain: Secondary | ICD-10-CM | POA: Diagnosis not present

## 2019-12-08 DIAGNOSIS — Z96659 Presence of unspecified artificial knee joint: Secondary | ICD-10-CM | POA: Diagnosis not present

## 2019-12-08 DIAGNOSIS — M545 Low back pain: Secondary | ICD-10-CM | POA: Diagnosis not present

## 2019-12-11 DIAGNOSIS — M545 Low back pain: Secondary | ICD-10-CM | POA: Diagnosis not present

## 2019-12-11 DIAGNOSIS — Z96659 Presence of unspecified artificial knee joint: Secondary | ICD-10-CM | POA: Diagnosis not present

## 2019-12-11 DIAGNOSIS — Z23 Encounter for immunization: Secondary | ICD-10-CM | POA: Diagnosis not present

## 2019-12-15 DIAGNOSIS — M545 Low back pain: Secondary | ICD-10-CM | POA: Diagnosis not present

## 2019-12-15 DIAGNOSIS — Z96659 Presence of unspecified artificial knee joint: Secondary | ICD-10-CM | POA: Diagnosis not present

## 2019-12-18 DIAGNOSIS — M545 Low back pain: Secondary | ICD-10-CM | POA: Diagnosis not present

## 2019-12-18 DIAGNOSIS — Z96659 Presence of unspecified artificial knee joint: Secondary | ICD-10-CM | POA: Diagnosis not present

## 2019-12-22 DIAGNOSIS — M545 Low back pain: Secondary | ICD-10-CM | POA: Diagnosis not present

## 2019-12-22 DIAGNOSIS — Z96659 Presence of unspecified artificial knee joint: Secondary | ICD-10-CM | POA: Diagnosis not present

## 2019-12-29 DIAGNOSIS — H26492 Other secondary cataract, left eye: Secondary | ICD-10-CM | POA: Diagnosis not present

## 2019-12-29 DIAGNOSIS — H18593 Other hereditary corneal dystrophies, bilateral: Secondary | ICD-10-CM | POA: Diagnosis not present

## 2019-12-29 DIAGNOSIS — H524 Presbyopia: Secondary | ICD-10-CM | POA: Diagnosis not present

## 2019-12-29 DIAGNOSIS — H401134 Primary open-angle glaucoma, bilateral, indeterminate stage: Secondary | ICD-10-CM | POA: Diagnosis not present

## 2020-02-02 DIAGNOSIS — H401134 Primary open-angle glaucoma, bilateral, indeterminate stage: Secondary | ICD-10-CM | POA: Diagnosis not present

## 2020-03-12 DIAGNOSIS — R102 Pelvic and perineal pain: Secondary | ICD-10-CM | POA: Diagnosis not present

## 2020-03-12 DIAGNOSIS — N390 Urinary tract infection, site not specified: Secondary | ICD-10-CM | POA: Diagnosis not present

## 2020-06-17 DIAGNOSIS — Z1231 Encounter for screening mammogram for malignant neoplasm of breast: Secondary | ICD-10-CM | POA: Diagnosis not present

## 2020-06-17 DIAGNOSIS — Z1212 Encounter for screening for malignant neoplasm of rectum: Secondary | ICD-10-CM | POA: Diagnosis not present

## 2020-06-17 DIAGNOSIS — N952 Postmenopausal atrophic vaginitis: Secondary | ICD-10-CM | POA: Diagnosis not present

## 2020-06-17 DIAGNOSIS — Z9289 Personal history of other medical treatment: Secondary | ICD-10-CM | POA: Diagnosis not present

## 2020-08-02 DIAGNOSIS — H26492 Other secondary cataract, left eye: Secondary | ICD-10-CM | POA: Diagnosis not present

## 2020-08-02 DIAGNOSIS — H401134 Primary open-angle glaucoma, bilateral, indeterminate stage: Secondary | ICD-10-CM | POA: Diagnosis not present

## 2020-08-02 DIAGNOSIS — H18593 Other hereditary corneal dystrophies, bilateral: Secondary | ICD-10-CM | POA: Diagnosis not present

## 2020-09-18 ENCOUNTER — Telehealth: Payer: Self-pay | Admitting: Family Medicine

## 2020-09-18 NOTE — Telephone Encounter (Signed)
LVM for pt to rtn my call to schedule AWV with [facility].  

## 2020-09-25 DIAGNOSIS — J029 Acute pharyngitis, unspecified: Secondary | ICD-10-CM | POA: Diagnosis not present

## 2020-09-25 DIAGNOSIS — R059 Cough, unspecified: Secondary | ICD-10-CM | POA: Diagnosis not present

## 2020-09-25 DIAGNOSIS — R52 Pain, unspecified: Secondary | ICD-10-CM | POA: Diagnosis not present

## 2020-09-25 DIAGNOSIS — U071 COVID-19: Secondary | ICD-10-CM | POA: Diagnosis not present

## 2020-09-25 DIAGNOSIS — R6883 Chills (without fever): Secondary | ICD-10-CM | POA: Diagnosis not present

## 2020-11-13 IMAGING — DX DG KNEE 1-2V PORT*R*
2 series · 2 of 2 positions shown · non-contrast
Comparison: None.

CLINICAL DATA: Right knee replacement

EXAM:
PORTABLE RIGHT KNEE - 2 VIEW

[knee ap]
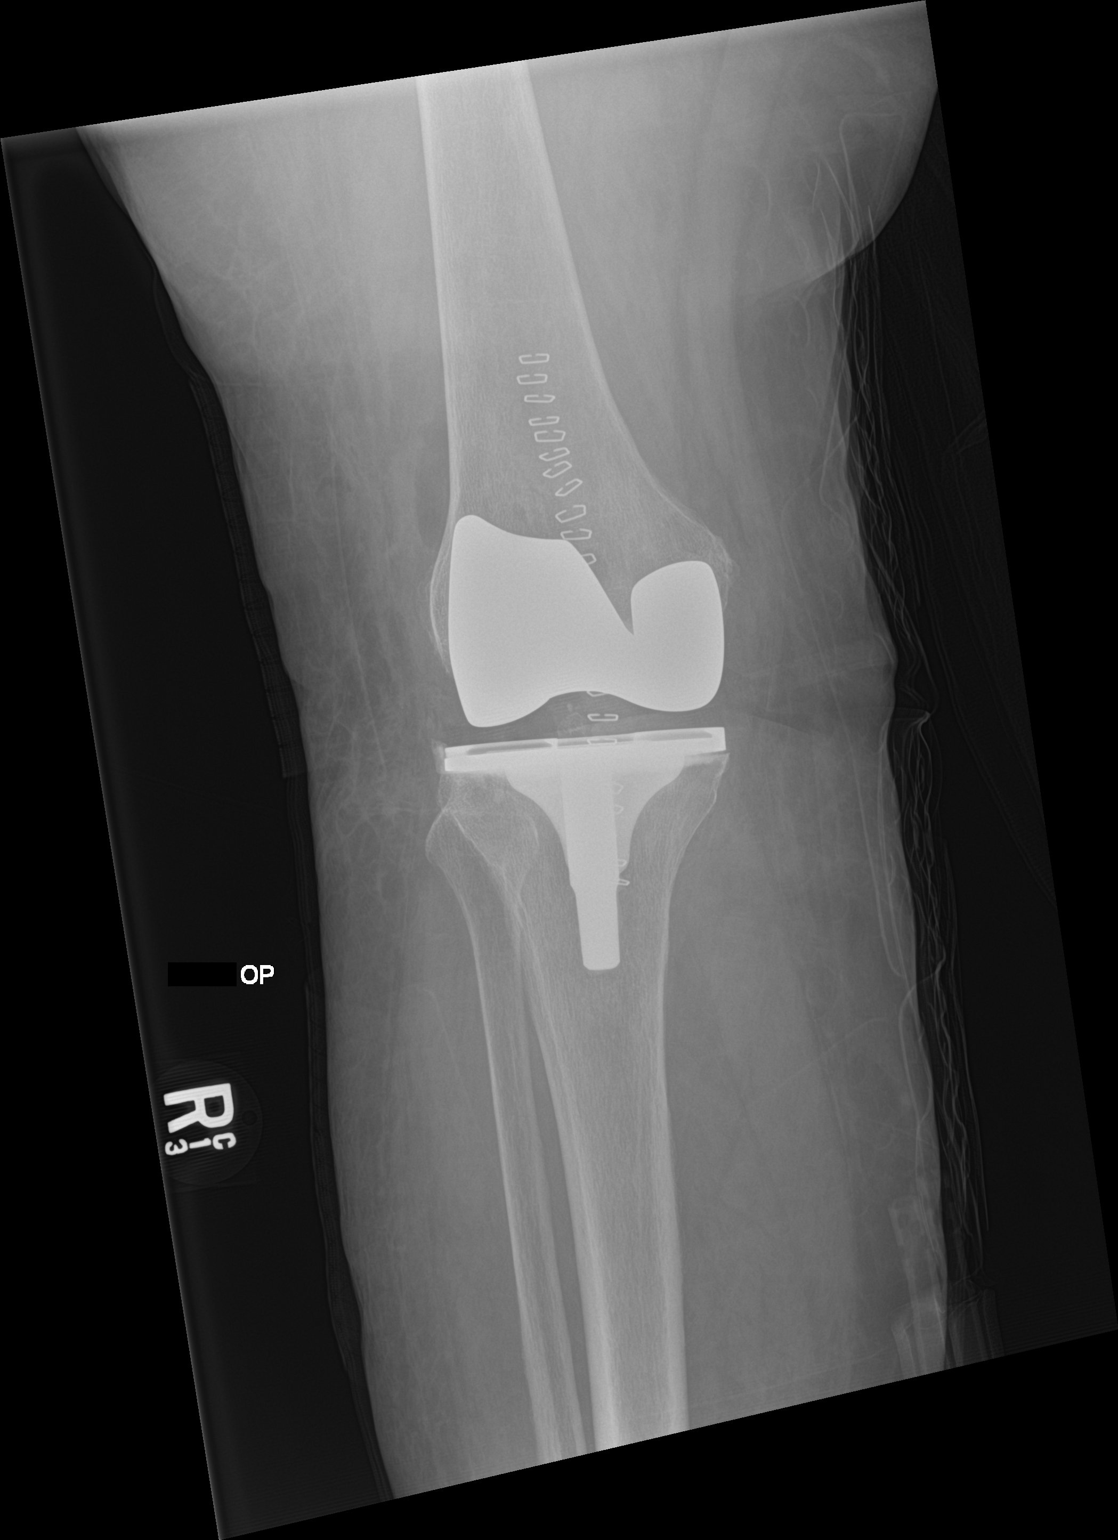

[knee lat]
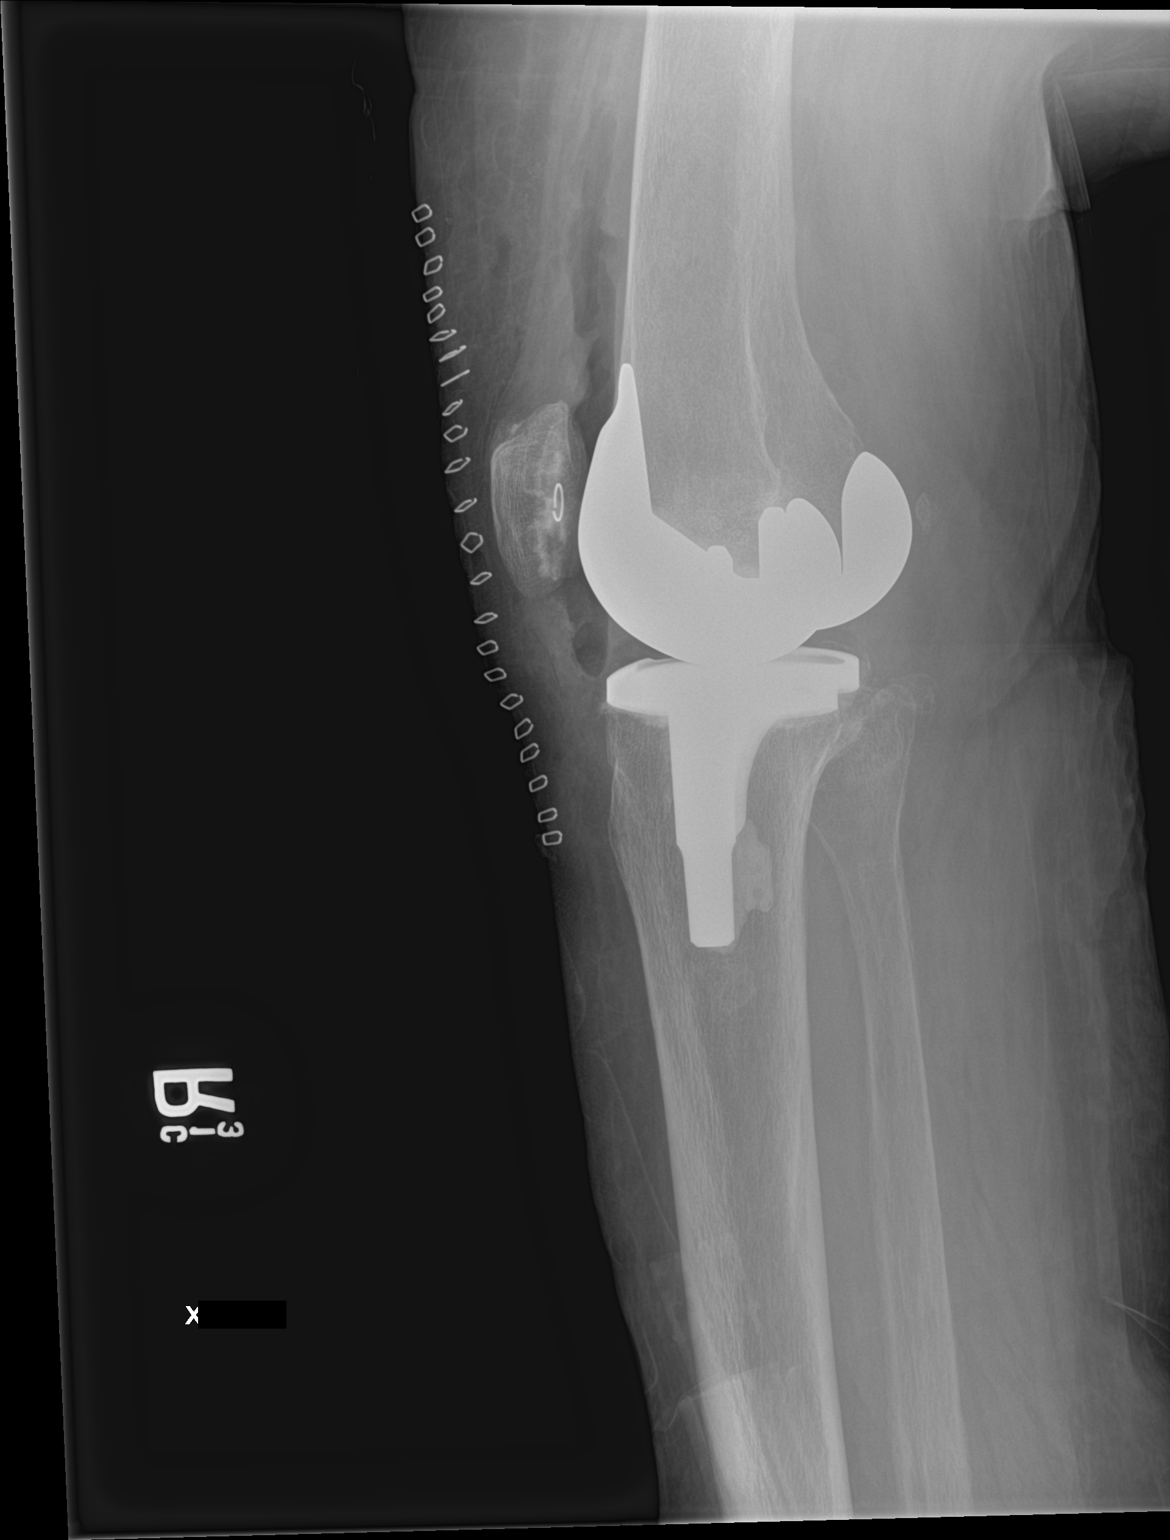

[2 of 2 positions shown; findings below may reference images not displayed]

FINDINGS: Knee prosthesis is noted. No soft tissue or bony abnormality is
seen.
IMPRESSION: Status post right knee replacement.

## 2021-01-06 ENCOUNTER — Telehealth: Payer: Self-pay | Admitting: Family Medicine

## 2021-01-06 NOTE — Telephone Encounter (Signed)
LVM for pt to rtn my call to schedule AWV with [facility].  

## 2021-02-03 DIAGNOSIS — H401134 Primary open-angle glaucoma, bilateral, indeterminate stage: Secondary | ICD-10-CM | POA: Diagnosis not present

## 2021-03-03 DIAGNOSIS — R350 Frequency of micturition: Secondary | ICD-10-CM | POA: Diagnosis not present

## 2021-03-03 DIAGNOSIS — R8290 Unspecified abnormal findings in urine: Secondary | ICD-10-CM | POA: Diagnosis not present

## 2021-04-29 DIAGNOSIS — Z20822 Contact with and (suspected) exposure to covid-19: Secondary | ICD-10-CM | POA: Diagnosis not present

## 2021-05-16 ENCOUNTER — Encounter: Payer: Self-pay | Admitting: Nurse Practitioner

## 2021-05-16 ENCOUNTER — Ambulatory Visit (INDEPENDENT_AMBULATORY_CARE_PROVIDER_SITE_OTHER): Payer: Medicare Other | Admitting: Nurse Practitioner

## 2021-05-16 ENCOUNTER — Ambulatory Visit (INDEPENDENT_AMBULATORY_CARE_PROVIDER_SITE_OTHER)
Admission: RE | Admit: 2021-05-16 | Discharge: 2021-05-16 | Disposition: A | Discharge status: Home / Self Care | Payer: Medicare Other | Source: Ambulatory Visit | Attending: Nurse Practitioner | Admitting: Nurse Practitioner

## 2021-05-16 ENCOUNTER — Other Ambulatory Visit: Payer: Self-pay | Admitting: Nurse Practitioner

## 2021-05-16 ENCOUNTER — Other Ambulatory Visit: Payer: Self-pay

## 2021-05-16 VITALS — BP 128/70 | HR 68 | Temp 98.1°F | Resp 10 | Ht 62.0 in | Wt 169.4 lb

## 2021-05-16 DIAGNOSIS — K5900 Constipation, unspecified: Secondary | ICD-10-CM | POA: Diagnosis not present

## 2021-05-16 DIAGNOSIS — R194 Change in bowel habit: Secondary | ICD-10-CM

## 2021-05-16 NOTE — Assessment & Plan Note (Signed)
Likely constipation.  But given she has never had a colonoscopy and she is having bowel changes over the last several months may be a good idea for her to be seen by GI.  We will send message to PCP in regards to further management after this acute episode.  Pending abdominal x-ray to verify colonic stool burden.

## 2021-05-16 NOTE — Progress Notes (Signed)
Acute Office Visit  Subjective:    Patient ID: Denise Norris, female    DOB: [birth year], 71 y.o.   MRN: [id number]  Chief Complaint  Patient presents with   Constipation    Has been having an issue for about a month now, not going regularly-use to go every day then every other day and the past 2 weeks it has been about every 2 days or less. Has been taking Laxatives and stool softners. At this time has not had a bowel movement since 05/10/21. Decreased appetite now. No abdominal pain, she does pass gas still.     Patient is in today for Constipation  States that the past few months she has had a change in her BM. States that she use to go every day. Now she is going less often and it is harder to go.  Over the past 2 weeks she has had to take laxatives. Last took 3 ducolax on Friday and had a bm on saturday and have not been since.   States she has never had a colonscopy. States that Dr. Para Marchuncan sent cologaurd but it was never done. States lost husband and grand daughter. Colon cancer: Paternal grand dad had a ostomy not sure if it was related to cancer or not. No history of abdominal surgeries and no SBO history per patient   Patient states she drinks plenty of water and [facility] to [facility]s and walks outside of that on occasion.  States she does eat vegetables and meat. Limits carbs and sugars.  Past Medical History:  Diagnosis Date   Arthritis    Connective tissue disorder (HCC)    Unspecified, previously followed by [facility].   GERD (gastroesophageal reflux disease)    Glaucoma    Migraine     Past Surgical History:  Procedure Laterality Date   BACK SURGERY     CARPAL TUNNEL RELEASE Bilateral    CATARACT EXTRACTION Bilateral    CERVICAL SPINE SURGERY     C5/C6 repair.  Previous right arm radiculopathy resolved with surgery.   JOINT REPLACEMENT     TOTAL KNEE ARTHROPLASTY Right 02/01/2019   Procedure: TOTAL KNEE ARTHROPLASTY;  Surgeon: Lyndle HerrlichBowers, Renika Shiflet  R, MD;  Location: [facility];  Service: Orthopedics;  Laterality: Right;   TOTAL KNEE ARTHROPLASTY Left 10/11/2019   Procedure: TOTAL KNEE ARTHROPLASTY;  Surgeon: Lyndle HerrlichBowers, Vonya Ohalloran R, MD;  Location: [facility];  Service: Orthopedics;  Laterality: Left;    Family History  Problem Relation Age of Onset   Breast cancer Sister    Colon cancer Neg Hx     Social History   Socioeconomic History   Marital status: Widowed    Spouse name: Not on file   Number of children: Not on file   Years of education: Not on file   Highest education level: Not on file  Occupational History   Not on file  Tobacco Use   Smoking status: Never   Smokeless tobacco: Never  Vaping Use   Vaping Use: Never used  Substance and Sexual Activity   Alcohol use: Never   Drug use: Never   Sexual activity: Not on file  Other Topics Concern   Not on file  Social History Narrative   Married 1972.   Undergraduate degree at [employer] [place].   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  Previously taught fourth through sixth grade  As of 2019-08-15 she splits time at her house and their son's house.      As of 08/15/19 has 1 living grandson.  Her granddaughter died in childhood August 15, 2018   Social Determinants of Health   Financial Resource Strain: Not on file  Food Insecurity: Not on file  Transportation Needs: Not on file  Physical Activity: Not on file  Stress: Not on file  Social Connections: Not on file  Intimate Partner Violence: Not on file    Outpatient Medications Prior to Visit  Medication Sig Dispense Refill   acetaminophen (TYLENOL) 500 MG tablet Take 1,000 mg by mouth in the morning and at bedtime.     docusate sodium (COLACE) 100 MG capsule Take 1 capsule (100 mg total) by mouth 2 (two) times daily. 20 capsule 0   famotidine (PEPCID) 20 MG tablet Take 20 mg by mouth 2 (two) times daily as needed for heartburn or indigestion.     Omega-3 Fatty  Acids (FISH OIL TRIPLE STRENGTH) 1400 MG CAPS Take 1,400 mg by mouth in the morning and at bedtime.     timolol (TIMOPTIC) 0.5 % ophthalmic solution Place 1 drop into both eyes 2 (two) times daily.     TURMERIC PO Take 667 mg by mouth in the morning and at bedtime.     HYDROcodone-acetaminophen (NORCO) 7.5-325 MG tablet Take 1 tablet by mouth every 4 (four) hours as needed for moderate pain (pain score 7-10). 30 tablet 0   methocarbamol (ROBAXIN) 500 MG tablet Take 1 tablet (500 mg total) by mouth 4 (four) times daily. 40 tablet 0   ondansetron (ZOFRAN) 4 MG tablet Take 1 tablet (4 mg total) by mouth every 6 (six) hours as needed for nausea. 20 tablet 0   rivaroxaban (XARELTO) 10 MG TABS tablet Take 1 tablet (10 mg total) by mouth daily with breakfast. 10 tablet 0   No facility-administered medications prior to visit.    Allergies  Allergen Reactions   Codeine Hives    Review of Systems  Constitutional:  Negative for chills and fever.  Gastrointestinal:  Positive for constipation and nausea (after laxative use with some vomitig). Negative for abdominal pain, blood in stool, diarrhea and vomiting.       Prior to taking laxatives it was pellet stools      Objective:    Physical Exam Constitutional:      Appearance: Normal appearance.  Cardiovascular:     Rate and Rhythm: Normal rate and regular rhythm.     Heart sounds: Normal heart sounds.  Pulmonary:     Effort: Pulmonary effort is normal.     Breath sounds: Normal breath sounds.  Abdominal:     General: Bowel sounds are normal. There is no distension.     Palpations: There is no mass.     Tenderness: There is no abdominal tenderness.  Neurological:     Mental Status: She is alert.    BP 128/70    Pulse 68    Temp 98.1 F (36.7 C)    Resp 10    Ht 5\' 2"  (1.575 m)    Wt 169 lb 6 oz (76.8 kg)    SpO2 96%    BMI 30.98 kg/m  Wt Readings from Last 3 Encounters:  05/16/21 169 lb 6 oz (76.8 kg)  10/11/19 165 lb (74.8 kg)   09/29/19 165 lb (74.8 kg)    Health Maintenance Due  Topic Date Due   COVID-19 Vaccine (1) Never done  Pneumonia Vaccine 70+ Years old (1 - PCV) Never done   Zoster Vaccines- Shingrix (1 of 2) Never done    There are no preventive care reminders to display for this patient.   No results found for: TSH Lab Results  Component Value Date   WBC 9.9 10/12/2019   HGB 10.7 (L) 10/12/2019   HCT 30.5 (L) 10/12/2019   MCV 88.9 10/12/2019   PLT 189 10/12/2019   Lab Results  Component Value Date   NA 138 10/12/2019   K 4.1 10/12/2019   CO2 25 10/12/2019   GLUCOSE 104 (H) 10/12/2019   BUN 23 10/12/2019   CREATININE 0.63 10/12/2019   BILITOT 0.4 09/22/2019   ALKPHOS 66 09/22/2019   AST 18 09/22/2019   ALT 15 09/22/2019   PROT 7.1 09/22/2019   ALBUMIN 4.0 09/22/2019   CALCIUM 8.6 (L) 10/12/2019   ANIONGAP 9 10/12/2019   GFR 93.65 09/22/2019   Lab Results  Component Value Date   CHOL 222 (H) 09/22/2019   Lab Results  Component Value Date   HDL 69.90 09/22/2019   Lab Results  Component Value Date   LDLCALC 143 (H) 09/22/2019   Lab Results  Component Value Date   TRIG 49.0 09/22/2019   Lab Results  Component Value Date   CHOLHDL 3 09/22/2019   No results found for: HGBA1C     Assessment & Plan:   Problem List Items Addressed This Visit       Other   Constipation - Primary    Likely constipation.  But given she has never had a colonoscopy and she is having bowel changes over the last several months may be a good idea for her to be seen by GI.  We will send message to PCP in regards to further management after this acute episode.  Pending abdominal x-ray to verify colonic stool burden.      Relevant Orders   DG Abd 2 Views (Completed)     No orders of the defined types were placed in this encounter.  This visit occurred during the SARS-CoV-2 public health emergency.  Safety protocols were in place, including screening questions prior to the visit,  additional usage of staff PPE, and extensive cleaning of exam room while observing appropriate contact time as indicated for disinfecting solutions.   Audria Nine, NP

## 2021-05-16 NOTE — Patient Instructions (Signed)
Nice to see you today  I will be in touch with the results of the xrays and information on the next steps Follow up if symptoms fail to improve or get worse

## 2021-05-19 ENCOUNTER — Telehealth: Payer: Self-pay

## 2021-05-19 NOTE — Telephone Encounter (Signed)
CALLED PATIENT NO ANSWER LEFT VOICEMAIL FOR A CALL BACK ? ?

## 2021-05-20 ENCOUNTER — Telehealth: Payer: Self-pay

## 2021-05-20 NOTE — Telephone Encounter (Signed)
CALLED NO ANSWER MAILBOX FULL LETTER SENT

## 2021-06-06 ENCOUNTER — Encounter: Payer: Self-pay | Admitting: Nurse Practitioner

## 2021-06-19 ENCOUNTER — Other Ambulatory Visit: Payer: Self-pay

## 2021-06-19 NOTE — Progress Notes (Signed)
Scheduled in person 08/13/2021

## 2021-06-23 DIAGNOSIS — N952 Postmenopausal atrophic vaginitis: Secondary | ICD-10-CM | POA: Diagnosis not present

## 2021-06-23 DIAGNOSIS — Z1231 Encounter for screening mammogram for malignant neoplasm of breast: Secondary | ICD-10-CM | POA: Diagnosis not present

## 2021-06-23 DIAGNOSIS — H26492 Other secondary cataract, left eye: Secondary | ICD-10-CM | POA: Diagnosis not present

## 2021-06-23 DIAGNOSIS — H401134 Primary open-angle glaucoma, bilateral, indeterminate stage: Secondary | ICD-10-CM | POA: Diagnosis not present

## 2021-06-23 DIAGNOSIS — Z9289 Personal history of other medical treatment: Secondary | ICD-10-CM | POA: Diagnosis not present

## 2021-06-23 DIAGNOSIS — Z124 Encounter for screening for malignant neoplasm of cervix: Secondary | ICD-10-CM | POA: Diagnosis not present

## 2021-06-23 DIAGNOSIS — Z1212 Encounter for screening for malignant neoplasm of rectum: Secondary | ICD-10-CM | POA: Diagnosis not present

## 2021-06-23 DIAGNOSIS — R350 Frequency of micturition: Secondary | ICD-10-CM | POA: Diagnosis not present

## 2021-07-23 IMAGING — DX DG KNEE 1-2V PORT*L*
2 series · 2 of 2 positions shown · non-contrast
Comparison: None.

CLINICAL DATA: Status post left total knee arthroplasty.

EXAM:
PORTABLE LEFT KNEE - 1-2 VIEW

[knee ap]
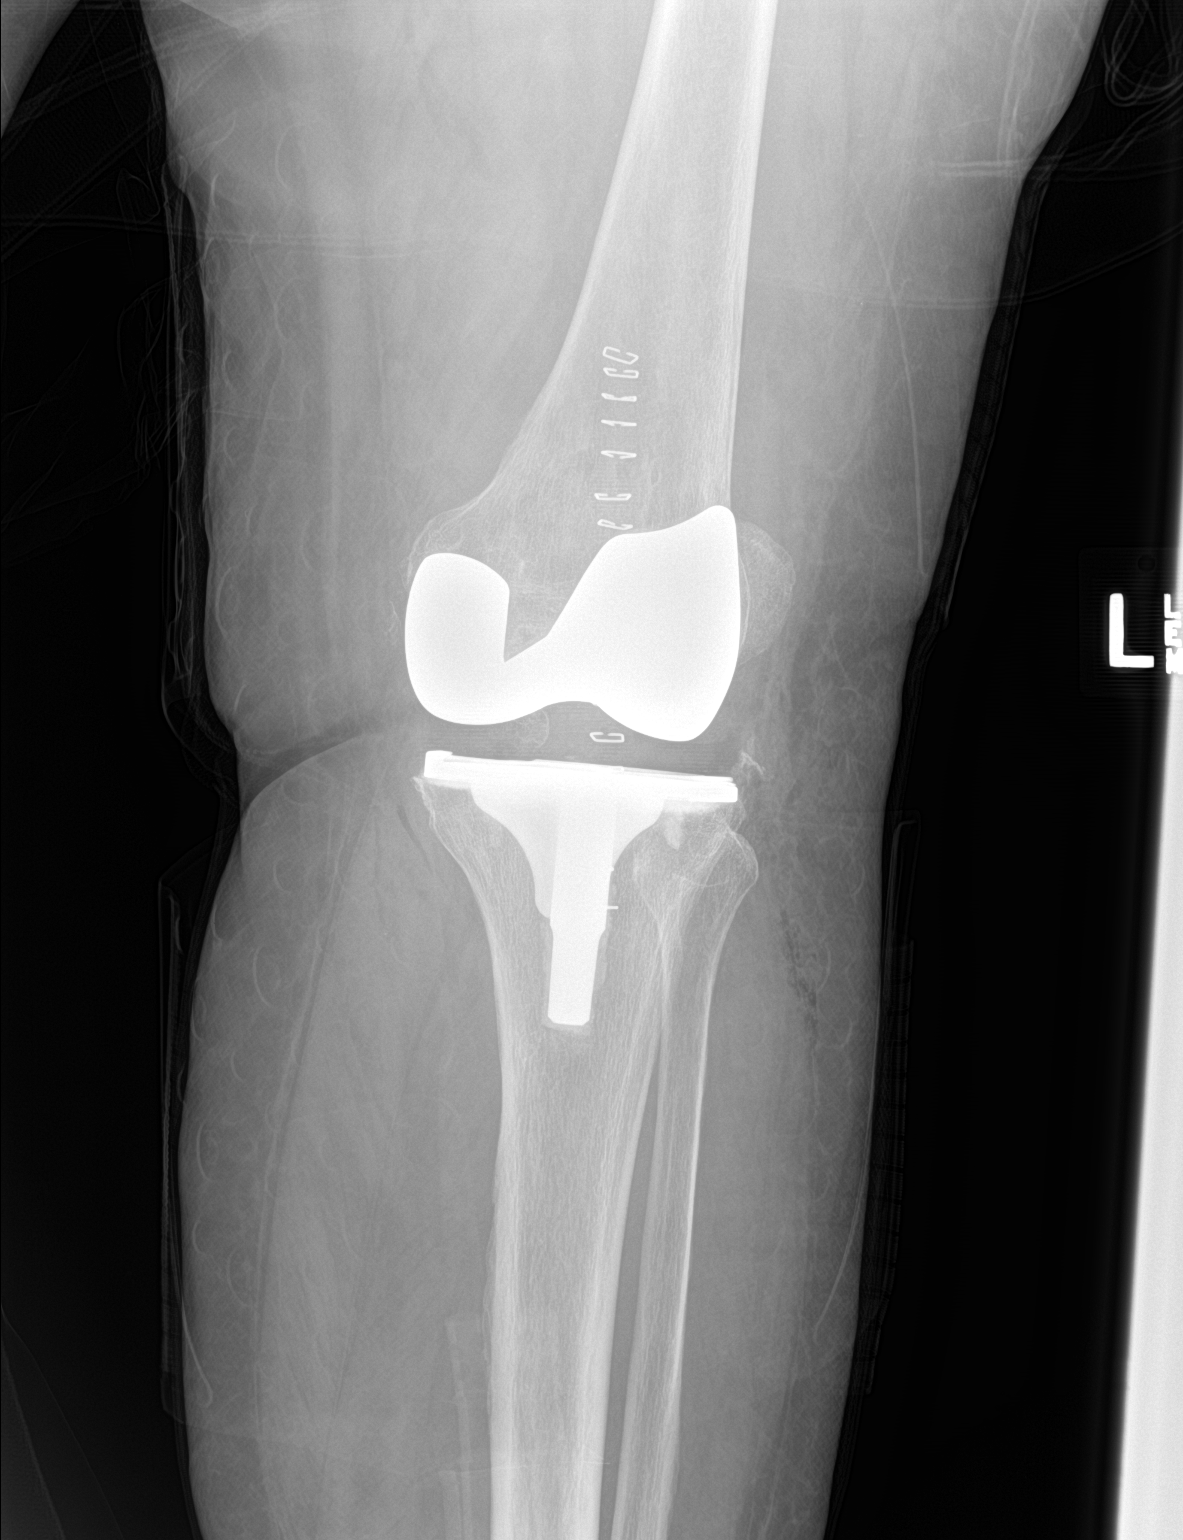

[knee lat]
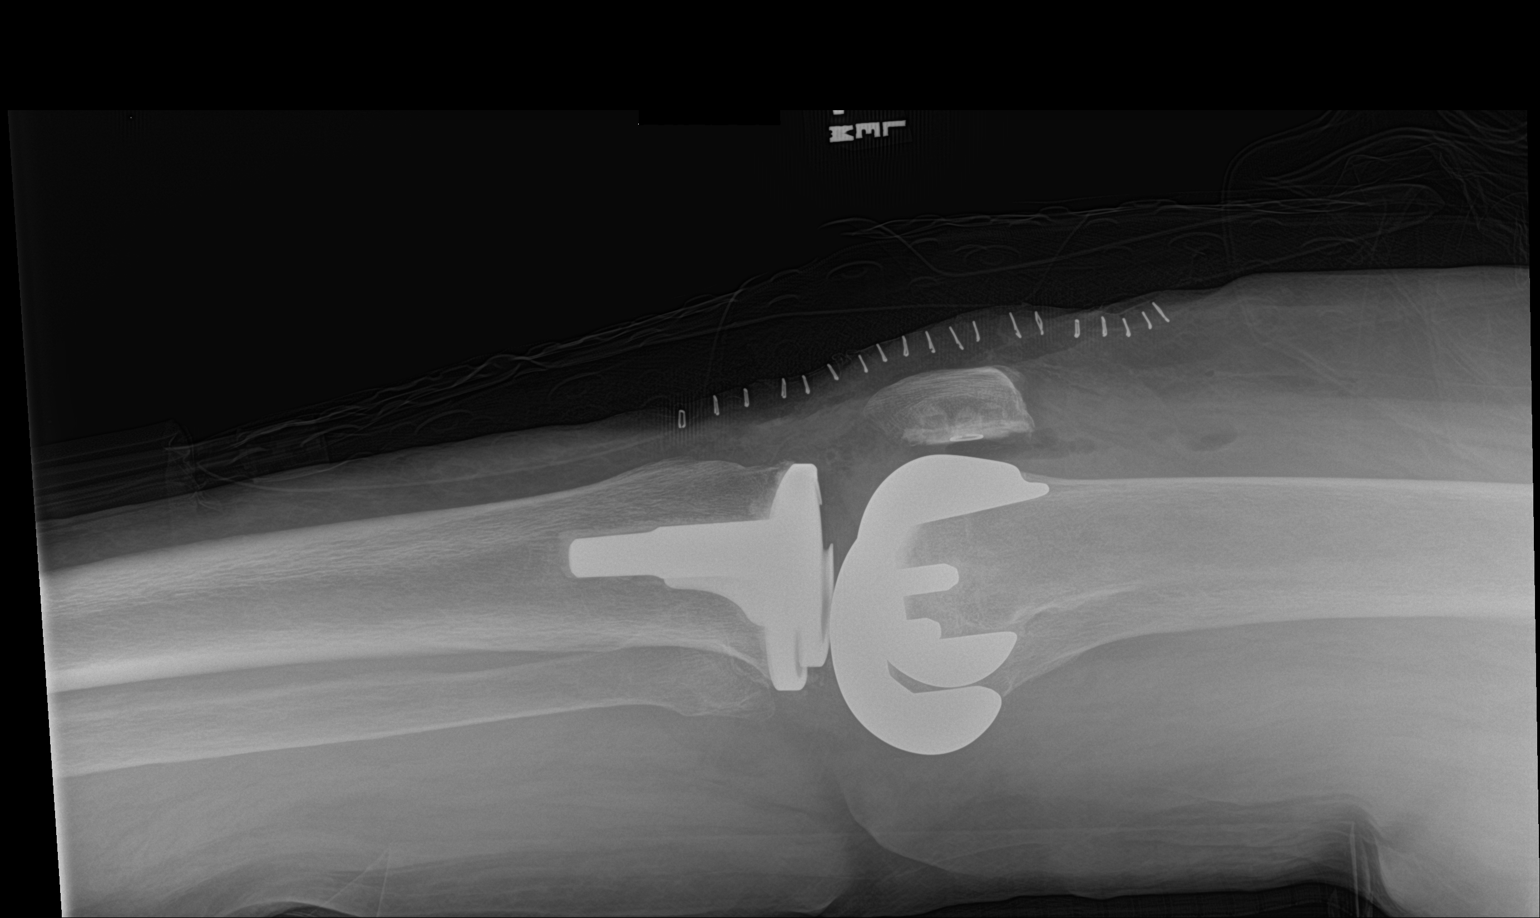

[2 of 2 positions shown; findings below may reference images not displayed]

FINDINGS: The left femoral and tibial components appear to be well situated.
Expected postoperative changes are noted in the soft tissues
anteriorly.
IMPRESSION: Status post left total knee arthroplasty.

## 2021-08-13 ENCOUNTER — Ambulatory Visit (INDEPENDENT_AMBULATORY_CARE_PROVIDER_SITE_OTHER): Payer: Medicare Other | Admitting: Gastroenterology

## 2021-08-13 ENCOUNTER — Encounter: Payer: Self-pay | Admitting: Gastroenterology

## 2021-08-13 VITALS — BP 184/88 | HR 88 | Temp 97.8°F | Ht 62.0 in | Wt 172.0 lb

## 2021-08-13 DIAGNOSIS — K5900 Constipation, unspecified: Secondary | ICD-10-CM

## 2021-08-13 DIAGNOSIS — R194 Change in bowel habit: Secondary | ICD-10-CM

## 2021-08-13 MED ORDER — NA SULFATE-K SULFATE-MG SULF 17.5-3.13-1.6 GM/177ML PO SOLN: 1.0000 | Freq: Once | ORAL | 0 refills | Status: AC

## 2021-08-13 NOTE — Addendum Note (Signed)
Addended by: Lurlean Nanny on: 08/13/2021 11:12 AM ? ? Modules accepted: Orders ? ?

## 2021-08-13 NOTE — Progress Notes (Signed)
? ? ?Gastroenterology Consultation ? ?Referring Provider:     Joaquim Nam, MD ?Primary Care Physician:  Joaquim Nam, MD ?Primary Gastroenterologist:  Dr. Servando Snare     ?Reason for Consultation:     Constipation ?      ? HPI:   ?Denise Norris is a 70 y.o. y/o female referred for consultation & management of Constipation by Dr. Para March, Dwana Curd, MD.  This patient comes in after being seen by her primary care practice for constipation.  He appears in the patient had been having constipation for about a month when she was seen by her PCPs office back in January.  At that time the patient stated that she was taking laxatives and had never had a colonoscopy.  She also reported that she had been sent off for Cologard but never had that done. ?She has lost 50 lbs dieting over the last 2 years. She says she feels like she in not completely evacuating. She has episodes every few months with vomiting and diarrhea and mostly with salad. ? ?Past Medical History:  ?Diagnosis Date  ? Arthritis   ? Connective tissue disorder (HCC)   ? Unspecified, previously followed by [facility].  ? GERD (gastroesophageal reflux disease)   ? Glaucoma   ? Migraine   ? ? ?Past Surgical History:  ?Procedure Laterality Date  ? BACK SURGERY    ? CARPAL TUNNEL RELEASE Bilateral   ? CATARACT EXTRACTION Bilateral   ? CERVICAL SPINE SURGERY    ? C5/C6 repair.  Previous right arm radiculopathy resolved with surgery.  ? JOINT REPLACEMENT    ? 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 Procedure: TOTAL KNEE ARTHROPLASTY;  Surgeon: [name], MD;  Location: [facility];  Service: Orthopedics;  Laterality: Right;  ? TOTAL KNEE ARTHROPLASTY Left 10/11/2019  ? Procedure: TOTAL KNEE ARTHROPLASTY;  Surgeon: [name], MD;  Location: [facility];  Service: Orthopedics;  Laterality: Left;  ? ? ?Prior to Admission medications   ?Medication Sig Start Date End Date Taking? Authorizing Provider  ?acetaminophen (TYLENOL) 500 MG tablet Take 1,000 mg by mouth  in the morning and at bedtime.    [provider]  ?docusate sodium (COLACE) 100 MG capsule Take 1 capsule (100 mg total) by mouth 2 (two) times daily. 10/12/19   Altamese Cabal, PA-C  ?famotidine (PEPCID) 20 MG tablet Take 20 mg by mouth 2 (two) times daily as needed for heartburn or indigestion.    [provider]  ?Omega-3 Fatty Acids (FISH OIL TRIPLE STRENGTH) 1400 MG CAPS Take 1,400 mg by mouth in the morning and at bedtime.    [provider]  ?timolol (TIMOPTIC) 0.5 % ophthalmic solution Place 1 drop into both eyes 2 (two) times daily. 07/08/19   [provider]  ?TURMERIC PO Take 667 mg by mouth in the morning and at bedtime.    [provider]  ? ? ?Family History  ?Problem Relation Age of Onset  ? Cancer Mother   ?     lung cancer  ? Breast cancer Sister   ? Colon cancer Neg Hx   ?  ? ?Social History  ? ?Tobacco Use  ? Smoking status: Never  ? Smokeless tobacco: Never  ?Vaping Use  ? Vaping Use: Never used  ?Substance Use Topics  ? Alcohol use: Never  ? Drug use: Never  ? ? ?Allergies as of 08/13/2021 - Review Complete 06/19/2021  ?Allergen Reaction Noted  ? Codeine Hives 03/10/2018  ? ? ?  Review of Systems:    ?All systems reviewed and negative except where noted in HPI. ? ? Physical Exam:  ?There were no vitals taken for this visit. ?No LMP recorded. Patient is postmenopausal. ?General:   Alert,  Well-developed, well-nourished, pleasant and cooperative in NAD ?Head:  Normocephalic and atraumatic. ?Eyes:  Sclera clear, no icterus.   Conjunctiva pink. ?Ears:  Normal auditory acuity. ?Neck:  Supple; no masses or thyromegaly. ?Lungs:  Respirations even and unlabored.  Clear throughout to auscultation.   No wheezes, crackles, or rhonchi. No acute distress. ?Heart:  Regular rate and rhythm; no murmurs, clicks, rubs, or gallops. ?Abdomen:  Normal bowel sounds.  No bruits.  Soft, non-tender and non-distended without masses, hepatosplenomegaly or hernias noted.  No  guarding or rebound tenderness.  Negative Carnett sign.   ?Rectal:  Deferred.  ?Pulses:  Normal pulses noted. ?Extremities:  No clubbing or edema.  No cyanosis. ?Neurologic:  Alert and oriented x3;  grossly normal neurologically. ?Skin:  Intact without significant lesions or rashes.  No jaundice. ?Lymph Nodes:  No significant cervical adenopathy. ?Psych:  Alert and cooperative. Normal mood and affect. ? ?Imaging Studies: ?No results found. ? ?Assessment and Plan:  ? ?Denise Norris is a 71 y.o. y/o female who comes in today with a change in bowel habits.  The patient has been more constipated and is concerned because of the change in bowel habits.  The patient will be set up for colonoscopy due to the change in bowel habits.  I have it discussed the intermittent nature of her vomiting happening every month or every other month and associate with salads and have told her that it is unlikely a chronic condition but something she may be exposed to.  The patient has been explained the plan and agrees with it ? ? ? ?Midge Minium, MD. Clementeen Graham ? ? ? Note: This dictation was prepared with Dragon dictation along with smaller phrase technology. Any transcriptional errors that result from this process are unintentional.   ?

## 2021-08-13 NOTE — H&P (View-Only) (Signed)
? ? ?Gastroenterology Consultation ? ?Referring Provider:     Duncan, Graham S, MD ?Primary Care Physician:  Duncan, Graham S, MD ?Primary Gastroenterologist:  Dr. Kori Colin     ?Reason for Consultation:     Constipation ?      ? HPI:   ?Denise Norris is a 71 y.o. y/o female referred for consultation & management of Constipation by Dr. Duncan, Graham S, MD.  This patient comes in after being seen by her primary care practice for constipation.  He appears in the patient had been having constipation for about a month when she was seen by her PCPs office back in January.  At that time the patient stated that she was taking laxatives and had never had a colonoscopy.  She also reported that she had been sent off for Cologard but never had that done. ?She has lost 50 lbs dieting over the last 2 years. She says she feels like she in not completely evacuating. She has episodes every few months with vomiting and diarrhea and mostly with salad. ? ?Past Medical History:  ?Diagnosis Date  ? Arthritis   ? Connective tissue disorder (HCC)   ? Unspecified, previously followed by [facility].  ? GERD (gastroesophageal reflux disease)   ? Glaucoma   ? Migraine   ? ? ?Past Surgical History:  ?Procedure Laterality Date  ? BACK SURGERY    ? CARPAL TUNNEL RELEASE Bilateral   ? CATARACT EXTRACTION Bilateral   ? CERVICAL SPINE SURGERY    ? C5/C6 repair.  Previous right arm radiculopathy resolved with surgery.  ? JOINT REPLACEMENT    ? 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 Procedure: TOTAL KNEE ARTHROPLASTY;  Surgeon: Bowers, James R, MD;  Location: [facility];  Service: Orthopedics;  Laterality: Right;  ? TOTAL KNEE ARTHROPLASTY Left 10/11/2019  ? Procedure: TOTAL KNEE ARTHROPLASTY;  Surgeon: Bowers, James R, MD;  Location: [facility];  Service: Orthopedics;  Laterality: Left;  ? ? ?Prior to Admission medications   ?Medication Sig Start Date End Date Taking? Authorizing Provider  ?acetaminophen (TYLENOL) 500 MG tablet Take 1,000 mg by mouth  in the morning and at bedtime.    [provider]  ?docusate sodium (COLACE) 100 MG capsule Take 1 capsule (100 mg total) by mouth 2 (two) times daily. 10/12/19   Jones, Maurice, PA-C  ?famotidine (PEPCID) 20 MG tablet Take 20 mg by mouth 2 (two) times daily as needed for heartburn or indigestion.    [provider]  ?Omega-3 Fatty Acids (FISH OIL TRIPLE STRENGTH) 1400 MG CAPS Take 1,400 mg by mouth in the morning and at bedtime.    [provider]  ?timolol (TIMOPTIC) 0.5 % ophthalmic solution Place 1 drop into both eyes 2 (two) times daily. 07/08/19   [provider]  ?TURMERIC PO Take 667 mg by mouth in the morning and at bedtime.    [provider]  ? ? ?Family History  ?Problem Relation Age of Onset  ? Cancer Mother   ?     lung cancer  ? Breast cancer Sister   ? Colon cancer Neg Hx   ?  ? ?Social History  ? ?Tobacco Use  ? Smoking status: Never  ? Smokeless tobacco: Never  ?Vaping Use  ? Vaping Use: Never used  ?Substance Use Topics  ? Alcohol use: Never  ? Drug use: Never  ? ? ?Allergies as of 08/13/2021 - Review Complete 06/19/2021  ?Allergen Reaction Noted  ? Codeine Hives 03/10/2018  ? ? ?  Review of Systems:    ?All systems reviewed and negative except where noted in HPI. ? ? Physical Exam:  ?There were no vitals taken for this visit. ?No LMP recorded. Patient is postmenopausal. ?General:   Alert,  Well-developed, well-nourished, pleasant and cooperative in NAD ?Head:  Normocephalic and atraumatic. ?Eyes:  Sclera clear, no icterus.   Conjunctiva pink. ?Ears:  Normal auditory acuity. ?Neck:  Supple; no masses or thyromegaly. ?Lungs:  Respirations even and unlabored.  Clear throughout to auscultation.   No wheezes, crackles, or rhonchi. No acute distress. ?Heart:  Regular rate and rhythm; no murmurs, clicks, rubs, or gallops. ?Abdomen:  Normal bowel sounds.  No bruits.  Soft, non-tender and non-distended without masses, hepatosplenomegaly or hernias noted.  No  guarding or rebound tenderness.  Negative Carnett sign.   ?Rectal:  Deferred.  ?Pulses:  Normal pulses noted. ?Extremities:  No clubbing or edema.  No cyanosis. ?Neurologic:  Alert and oriented x3;  grossly normal neurologically. ?Skin:  Intact without significant lesions or rashes.  No jaundice. ?Lymph Nodes:  No significant cervical adenopathy. ?Psych:  Alert and cooperative. Normal mood and affect. ? ?Imaging Studies: ?No results found. ? ?Assessment and Plan:  ? ?Denise Norris is a 71 y.o. y/o female who comes in today with a change in bowel habits.  The patient has been more constipated and is concerned because of the change in bowel habits.  The patient will be set up for colonoscopy due to the change in bowel habits.  I have it discussed the intermittent nature of her vomiting happening every month or every other month and associate with salads and have told her that it is unlikely a chronic condition but something she may be exposed to.  The patient has been explained the plan and agrees with it ? ? ? ?Midge Minium, MD. Clementeen Graham ? ? ? Note: This dictation was prepared with Dragon dictation along with smaller phrase technology. Any transcriptional errors that result from this process are unintentional.   ?

## 2021-08-21 ENCOUNTER — Encounter: Payer: Self-pay | Admitting: Gastroenterology

## 2021-08-22 ENCOUNTER — Telehealth: Payer: Self-pay | Admitting: Gastroenterology

## 2021-08-22 NOTE — Telephone Encounter (Signed)
Patient called and stated that her bowel prep has not been sent to the pharmacy and she was wondering when it would be and when she needs to go pick it up. Requesting a call back. ?

## 2021-08-25 MED ORDER — CLENPIQ 10-3.5-12 MG-GM -GM/160ML PO SOLN: 1.0000 | Freq: Once | ORAL | 0 refills | Status: AC

## 2021-08-25 NOTE — Telephone Encounter (Signed)
I called the pharmacy and they stated that they did have Rx on file, but it was not covered, Clenpiq was covered but cost is $140.... Pt states there is no way she would be able to drink the gallon prep... Clenpiq sample offered and placed in the front office with instructions for pt to pick up ?

## 2021-08-25 NOTE — Addendum Note (Signed)
Addended by: Lurlean Nanny on: 08/25/2021 09:53 AM ? ? Modules accepted: Orders ? ?

## 2021-09-01 ENCOUNTER — Encounter
Admission: RE | Disposition: A | Discharge status: Home / Self Care | Payer: Self-pay | Source: Home / Self Care | Attending: Gastroenterology

## 2021-09-01 ENCOUNTER — Encounter: Payer: Self-pay | Admitting: Gastroenterology

## 2021-09-01 ENCOUNTER — Ambulatory Visit
Admission: RE | Admit: 2021-09-01 | Discharge: 2021-09-01 | Disposition: A | Discharge status: Home / Self Care | Payer: Medicare Other | Attending: Gastroenterology | Admitting: Gastroenterology

## 2021-09-01 ENCOUNTER — Ambulatory Visit: Payer: Medicare Other | Admitting: Anesthesiology

## 2021-09-01 ENCOUNTER — Other Ambulatory Visit: Payer: Self-pay

## 2021-09-01 DIAGNOSIS — R194 Change in bowel habit: Secondary | ICD-10-CM | POA: Diagnosis not present

## 2021-09-01 DIAGNOSIS — K5900 Constipation, unspecified: Secondary | ICD-10-CM | POA: Diagnosis not present

## 2021-09-01 DIAGNOSIS — K621 Rectal polyp: Secondary | ICD-10-CM | POA: Insufficient documentation

## 2021-09-01 DIAGNOSIS — K640 First degree hemorrhoids: Secondary | ICD-10-CM | POA: Diagnosis not present

## 2021-09-01 DIAGNOSIS — K219 Gastro-esophageal reflux disease without esophagitis: Secondary | ICD-10-CM | POA: Insufficient documentation

## 2021-09-01 HISTORY — PX: POLYPECTOMY: SHX5525

## 2021-09-01 HISTORY — PX: COLONOSCOPY: SHX5424

## 2021-09-01 SURGERY — COLONOSCOPY
Anesthesia: General | Site: Rectum

## 2021-09-01 MED ORDER — STERILE WATER FOR IRRIGATION IR SOLN
Status: DC | PRN
Start: 2021-09-01 — End: 2021-09-01
  Administered 2021-09-01: 250 mL

## 2021-09-01 MED ORDER — LACTATED RINGERS IV SOLN: INTRAVENOUS | Status: DC

## 2021-09-01 MED ORDER — LIDOCAINE HCL (CARDIAC) PF 100 MG/5ML IV SOSY
PREFILLED_SYRINGE | INTRAVENOUS | Status: DC | PRN
Start: 2021-09-01 — End: 2021-09-01
  Administered 2021-09-01: 30 mg via INTRAVENOUS

## 2021-09-01 MED ORDER — ACETAMINOPHEN 325 MG PO TABS: 325.0000 mg | ORAL_TABLET | ORAL | Status: DC | PRN

## 2021-09-01 MED ORDER — ACETAMINOPHEN 160 MG/5ML PO SOLN: 325.0000 mg | ORAL | Status: DC | PRN

## 2021-09-01 MED ORDER — SODIUM CHLORIDE 0.9 % IV SOLN: INTRAVENOUS | Status: DC

## 2021-09-01 MED ORDER — PROPOFOL 10 MG/ML IV BOLUS
INTRAVENOUS | Status: DC | PRN
  Administered 2021-09-01: 20 mg via INTRAVENOUS
  Administered 2021-09-01: 100 mg via INTRAVENOUS
  Administered 2021-09-01: 40 mg via INTRAVENOUS
  Administered 2021-09-01: 30 mg via INTRAVENOUS
  Administered 2021-09-01 (×3): 20 mg via INTRAVENOUS

## 2021-09-01 SURGICAL SUPPLY — 7 items
FORCEPS BIOP RAD 4 LRG CAP 4 (CUTTING FORCEPS) ×1 IMPLANT
GOWN CVR UNV OPN BCK APRN NK (MISCELLANEOUS) ×4 IMPLANT
GOWN ISOL THUMB LOOP REG UNIV (MISCELLANEOUS) ×6
KIT PRC NS LF DISP ENDO (KITS) ×2 IMPLANT
KIT PROCEDURE OLYMPUS (KITS) ×3
MANIFOLD NEPTUNE II (INSTRUMENTS) ×3 IMPLANT
WATER STERILE IRR 250ML POUR (IV SOLUTION) ×3 IMPLANT

## 2021-09-01 NOTE — Anesthesia Postprocedure Evaluation (Signed)
Anesthesia Post Note ? ?Patient: Denise Norris ? ?Procedure(s) Performed: COLONOSCOPY (Rectum) ? ? ?  ?Patient location during evaluation: PACU ?Anesthesia Type: General ?Level of consciousness: awake and alert ?Pain management: pain level controlled ?Vital Signs Assessment: post-procedure vital signs reviewed and stable ?Respiratory status: spontaneous breathing, nonlabored ventilation, respiratory function stable and patient connected to nasal cannula oxygen ?Cardiovascular status: blood pressure returned to baseline and stable ?Postop Assessment: no apparent nausea or vomiting ?Anesthetic complications: no ? ? ?No notable events documented. ? ?Alta Corning ? ? ? ? ? ?

## 2021-09-01 NOTE — Op Note (Signed)
[facility] ?Gastroenterology ?Patient Name: Denise Punchesmma Bouton ?Procedure Date: 09/01/2021 8:13 AM ?MRN: [id number] ?Account #: [id number] ?Date of Birth: [birth year] ?Admit Type: Outpatient ?Age: [age] ?[facility] ?Gender: Female ?Note Status: Finalized ?Instrument Name: [id number] ?Procedure:             Colonoscopy ?Indications:           Change in bowel habits ?Providers:             Midge Miniumarren Geary Rufo MD, MD ?Referring MD:          Dwana CurdGraham S. Para Marchuncan, MD (Referring MD) ?Medicines:             Propofol per Anesthesia ?Complications:         No immediate complications. ?Procedure:             Pre-Anesthesia Assessment: ?                       - Prior to the procedure, a History and Physical was  ?                       performed, and patient medications and allergies were  ?                       reviewed. The patient's tolerance of previous  ?                       anesthesia was also reviewed. The risks and benefits  ?                       of the procedure and the sedation options and risks  ?                       were discussed with the patient. All questions were  ?                       answered, and informed consent was obtained. Prior  ?                       Anticoagulants: The patient has taken no previous  ?                       anticoagulant or antiplatelet agents. ASA Grade  ?                       Assessment: II - A patient with mild systemic disease.  ?                       After reviewing the risks and benefits, the patient  ?                       was deemed in satisfactory condition to undergo the  ?                       procedure. ?                       After obtaining informed consent, the colonoscope was  ?  passed under direct vision. Throughout the procedure,  ?                       the patient's blood pressure, pulse, and oxygen  ?                       saturations were monitored continuously. The  ?                       Colonoscope was introduced through  the anus and  ?                       advanced to the the cecum, identified by appendiceal  ?                       orifice and ileocecal valve. The colonoscopy was  ?                       performed without difficulty. The patient tolerated  ?                       the procedure well. The quality of the bowel  ?                       preparation was excellent. ?Findings: ?     The perianal and digital rectal examinations were normal. ?     A 7 mm polyp was found in the rectum. The polyp was sessile. Biopsies  ?     were taken with a cold forceps for histology. ?     Non-bleeding internal hemorrhoids were found during retroflexion. The  ?     hemorrhoids were Grade I (internal hemorrhoids that do not prolapse). ?Impression:            - One 7 mm polyp in the rectum. Biopsied. ?                       - Non-bleeding internal hemorrhoids. ?Recommendation:        - Discharge patient to home. ?                       - Resume previous diet. ?                       - Continue present medications. ?                       - Await pathology results. ?                       - If adenomatous then flex sig to remove the lesion. ?Procedure Code(s):     --- Professional --- ?                       [phone], Colonoscopy, flexible; with biopsy, single or  ?                       multiple ?Diagnosis Code(s):     --- Professional --- ?                       R19.4, Change  in bowel habit ?                       K62.1, Rectal polyp ?CPT copyright 2019 American Medical Association. All rights reserved. ?The codes documented in this report are preliminary and upon coder review may  ?be revised to meet current compliance requirements. ?Midge Minium MD, MD ?09/01/2021 8:32:25 AM ?This report has been signed electronically. ?Number of Addenda: 0 ?Note Initiated On: 09/01/2021 8:13 AM ?Scope Withdrawal Time: 0 hours 10 minutes 18 seconds  ?Total Procedure Duration: 0 hours 12 minutes 50 seconds  ?Estimated Blood Loss:  Estimated blood loss: none. ?      [facility] ?

## 2021-09-01 NOTE — Interval H&P Note (Signed)
? ?Midge Minium, MD [facility] ?[phone] ?[place], [place] [address] ?[phone] ?Fax : [phone] ? ?Primary Care Physician:  Joaquim Nam, MD ?Primary Gastroenterologist:  Dr. Servando Snare ? ?Pre-Procedure History & Physical: ?HPI:  Denise Norris is a 71 y.o. female is here for an colonoscopy. ?  ?Past Medical History:  ?Diagnosis Date  ? Arthritis   ? Connective tissue disorder (HCC)   ? Unspecified, previously followed by [facility].  ? GERD (gastroesophageal reflux disease)   ? Glaucoma   ? Migraine   ? ? ?Past Surgical History:  ?Procedure Laterality Date  ? BACK SURGERY    ? CARPAL TUNNEL RELEASE Bilateral   ? CATARACT EXTRACTION Bilateral   ? CERVICAL SPINE SURGERY    ? C5/C6 repair.  Previous right arm radiculopathy resolved with surgery.  ? JOINT REPLACEMENT    ? 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 Procedure: TOTAL KNEE ARTHROPLASTY;  Surgeon: [name], MD;  Location: [facility] ORS;  Service: Orthopedics;  Laterality: Right;  ? TOTAL KNEE ARTHROPLASTY Left 10/11/2019  ? Procedure: TOTAL KNEE ARTHROPLASTY;  Surgeon: [name], MD;  Location: [facility] ORS;  Service: Orthopedics;  Laterality: Left;  ? ? ?Prior to Admission medications   ?Medication Sig Start Date End Date Taking? Authorizing Provider  ?acetaminophen (TYLENOL) 500 MG tablet Take 1,000 mg by mouth in the morning and at bedtime.   Yes [provider]  ?famotidine (PEPCID) 20 MG tablet Take 20 mg by mouth 2 (two) times daily as needed for heartburn or indigestion.   Yes [provider]  ?timolol (TIMOPTIC) 0.5 % ophthalmic solution Place 1 drop into both eyes 2 (two) times daily. 07/08/19  Yes [provider]  ? ? ?Allergies as of 08/13/2021 - Review Complete 08/13/2021  ?Allergen Reaction Noted  ? Codeine Hives 03/10/2018  ? ? ?Family History  ?Problem Relation Age of Onset  ? Cancer Mother   ?     lung cancer  ? Breast cancer Sister   ? Colon cancer Neg Hx   ? ? ?Social History  ? ?Socioeconomic  History  ? Marital status: Widowed  ?  Spouse name: Not on file  ? Number of children: Not on file  ? Years of education: Not on file  ? Highest education level: Not on file  ?Occupational History  ? Not on file  ?Tobacco Use  ? Smoking status: Never  ? Smokeless tobacco: Never  ?Vaping Use  ? Vaping Use: Never used  ?Substance and Sexual Activity  ? Alcohol use: Never  ? Drug use: Never  ? Sexual activity: Not on file  ?Other Topics Concern  ? Not on file  ?Social History Narrative  ? Married Sep 04, 1970.  ? Undergraduate degree at [employer] in [place].  ? Masters degree at [employer]  ? Widowed (husband had memory loss)  ? 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 As of 09-04-19 she splits time at her house and their son's house.  ?   ? As of 04-Sep-2019 has 1 living grandson.  Her granddaughter died in childhood 09/04/18  ? ?Social Determinants of Health  ? ?Financial Resource Strain: Not on file  ?Food Insecurity: Not on file  ?Transportation Needs: Not on file  ?Physical Activity: Not on file  ?Stress: Not on file  ?Social Connections: Not on file  ?Intimate Partner Violence: Not on file  ? ? ?Review of Systems: ?See HPI, otherwise negative ROS ? ?  Physical Exam: ?Ht 5\' 2"  (1.575 m)   Wt 78 kg   BMI 31.46 kg/m?  ?General:   Alert,  pleasant and cooperative in NAD ?Head:  Normocephalic and atraumatic. ?Neck:  Supple; no masses or thyromegaly. ?Lungs:  Clear throughout to auscultation.    ?Heart:  Regular rate and rhythm. ?Abdomen:  Soft, nontender and nondistended. Normal bowel sounds, without guarding, and without rebound.   ?Neurologic:  Alert and  oriented x4;  grossly normal neurologically. ? ?Impression/Plan: ?Denise Norris is here for an colonoscopy to be performed for change in bowel habits ? ?Risks, benefits, limitations, and alternatives regarding  colonoscopy have been reviewed with the patient.  Questions have been answered.  All parties agreeable. ? ? ?[name], MD   09/01/2021, 7:34 AM ?

## 2021-09-01 NOTE — Anesthesia Procedure Notes (Signed)
Date/Time: 09/01/2021 8:19 AM ?Performed by: Maree Krabbe, CRNA ?Pre-anesthesia Checklist: Patient identified, Emergency Drugs available, Suction available, Timeout performed and Patient being monitored ?Patient Re-evaluated:Patient Re-evaluated prior to induction ?Oxygen Delivery Method: Nasal cannula ?Placement Confirmation: positive ETCO2 ? ? ? ? ?

## 2021-09-01 NOTE — Transfer of Care (Signed)
Immediate Anesthesia Transfer of Care Note ? ?Patient: Denise Norris ? ?Procedure(s) Performed: COLONOSCOPY (Rectum) ? ?Patient Location: PACU ? ?Anesthesia Type: General ? ?Level of Consciousness: awake, alert  and patient cooperative ? ?Airway and Oxygen Therapy: Patient Spontanous Breathing and Patient connected to supplemental oxygen ? ?Post-op Assessment: Post-op Vital signs reviewed, Patient's Cardiovascular Status Stable, Respiratory Function Stable, Patent Airway and No signs of Nausea or vomiting ? ?Post-op Vital Signs: Reviewed and stable ? ?Complications: No notable events documented. ? ?

## 2021-09-01 NOTE — Anesthesia Preprocedure Evaluation (Signed)
Anesthesia Evaluation  ?Patient identified by MRN, date of birth, ID band ?Patient awake ? ? ? ?Reviewed: ?Allergy & Precautions, H&P , NPO status , Patient's Chart, lab work & pertinent test results, reviewed documented beta blocker date and time  ? ?Airway ?Mallampati: II ? ?TM Distance: >3 FB ?Neck ROM: full ? ? ? Dental ?no notable dental hx. ? ?  ?Pulmonary ?neg pulmonary ROS,  ?  ?Pulmonary exam normal ?breath sounds clear to auscultation ? ? ? ? ? ? Cardiovascular ?Exercise Tolerance: Good ?negative cardio ROS ? ? ?Rhythm:regular Rate:Normal ? ?BP 195/95 today with "no history of HTN", although pt reports BP elevated in last couple of weeks.   ?  ?Neuro/Psych ?negative neurological ROS ? negative psych ROS  ? GI/Hepatic ?Neg liver ROS, GERD  ,  ?Endo/Other  ?negative endocrine ROS ? Renal/GU ?negative Renal ROS  ?negative genitourinary ?  ?Musculoskeletal ? ? Abdominal ?  ?Peds ? Hematology ?negative hematology ROS ?(+)   ?Anesthesia Other Findings ? ? Reproductive/Obstetrics ?negative OB ROS ? ?  ? ? ? ? ? ? ? ? ? ? ? ? ? ?  ?  ? ? ? ? ? ? ? ? ?Anesthesia Physical ?Anesthesia Plan ? ?ASA: 2 ? ?Anesthesia Plan: General  ? ?Post-op Pain Management:   ? ?Induction:  ? ?PONV Risk Score and Plan:  ? ?Airway Management Planned:  ? ?Additional Equipment:  ? ?Intra-op Plan:  ? ?Post-operative Plan:  ? ?Informed Consent: I have reviewed the patients History and Physical, chart, labs and discussed the procedure including the risks, benefits and alternatives for the proposed anesthesia with the patient or authorized representative who has indicated his/her understanding and acceptance.  ? ? ? ?Dental Advisory Given ? ?Plan Discussed with: CRNA and Anesthesiologist ? ?Anesthesia Plan Comments:   ? ? ? ? ? ? ?Anesthesia Quick Evaluation ? ?

## 2021-09-02 ENCOUNTER — Encounter: Payer: Self-pay | Admitting: Gastroenterology

## 2021-09-02 NOTE — Addendum Note (Signed)
Addendum  created 09/02/21 1300 by Maree Krabbe, CRNA  ? Intraprocedure Event edited  ?  ?

## 2021-09-03 ENCOUNTER — Encounter: Payer: Self-pay | Admitting: Gastroenterology

## 2021-09-03 LAB — SURGICAL PATHOLOGY

## 2021-09-04 ENCOUNTER — Ambulatory Visit (INDEPENDENT_AMBULATORY_CARE_PROVIDER_SITE_OTHER): Payer: Medicare Other | Admitting: Family Medicine

## 2021-09-04 ENCOUNTER — Encounter: Payer: Self-pay | Admitting: Family Medicine

## 2021-09-04 VITALS — BP 140/82 | HR 61 | Temp 97.2°F | Ht 62.0 in | Wt 170.0 lb

## 2021-09-04 DIAGNOSIS — R030 Elevated blood-pressure reading, without diagnosis of hypertension: Secondary | ICD-10-CM | POA: Diagnosis not present

## 2021-09-04 DIAGNOSIS — E785 Hyperlipidemia, unspecified: Secondary | ICD-10-CM | POA: Diagnosis not present

## 2021-09-04 LAB — CBC WITH DIFFERENTIAL/PLATELET
Basophils Absolute: 0 10*3/uL (ref 0.0–0.1)
Basophils Relative: 1.3 % (ref 0.0–3.0)
Eosinophils Absolute: 0.1 10*3/uL (ref 0.0–0.7)
Eosinophils Relative: 2.7 % (ref 0.0–5.0)
HCT: 39.5 % (ref 36.0–46.0)
Hemoglobin: 12.8 g/dL (ref 12.0–15.0)
Lymphocytes Relative: 32.4 % (ref 12.0–46.0)
Lymphs Abs: 1.2 10*3/uL (ref 0.7–4.0)
MCHC: 32.4 g/dL (ref 30.0–36.0)
MCV: 93 fl (ref 78.0–100.0)
Monocytes Absolute: 0.6 10*3/uL (ref 0.1–1.0)
Monocytes Relative: 15.5 % — ABNORMAL HIGH (ref 3.0–12.0)
Neutro Abs: 1.8 10*3/uL (ref 1.4–7.7)
Neutrophils Relative %: 48.1 % (ref 43.0–77.0)
Platelets: 205 10*3/uL (ref 150.0–400.0)
RBC: 4.24 Mil/uL (ref 3.87–5.11)
RDW: 13.9 % (ref 11.5–15.5)
WBC: 3.8 10*3/uL — ABNORMAL LOW (ref 4.0–10.5)

## 2021-09-04 LAB — BASIC METABOLIC PANEL
BUN: 23 mg/dL (ref 6–23)
CO2: 28 mEq/L (ref 19–32)
Calcium: 9 mg/dL (ref 8.4–10.5)
Chloride: 104 mEq/L (ref 96–112)
Creatinine, Ser: 0.64 mg/dL (ref 0.40–1.20)
GFR: 89.1 mL/min (ref >60.00)
Glucose, Bld: 87 mg/dL (ref 70–99)
Potassium: 4.3 mEq/L (ref 3.5–5.1)
Sodium: 138 mEq/L (ref 135–145)

## 2021-09-04 LAB — LIPID PANEL
Cholesterol: 223 mg/dL — ABNORMAL HIGH (ref 0–200)
HDL: 64.3 mg/dL (ref >39.00)
LDL Cholesterol: 145 mg/dL — ABNORMAL HIGH (ref 0–99)
NonHDL: 158.65
Total CHOL/HDL Ratio: 3
Triglycerides: 66 mg/dL (ref 0.0–149.0)
VLDL: 13.2 mg/dL (ref 0.0–40.0)

## 2021-09-04 MED ORDER — AMLODIPINE BESYLATE 2.5 MG PO TABS: 2.5000 mg | ORAL_TABLET | Freq: Every day | ORAL | 1 refills | Status: DC

## 2021-09-04 NOTE — Progress Notes (Signed)
Hypertension:    ?Not lightheaded.  Some occ HA when BP is elevated.  No meds.   ?Chest pain with exertion:no ?Edema:no ?Short of breath:no ?Average home BPs: 150-160s/80s.  ?BP was higher at the time of colonoscopy ? ?Stressors d/w pt.  She is already on a low carb diet.  Her church is disaffiliating and that is a stressor.  She doesn't want to be a burden to her family.  She can update me as needed.  Discussed counseling if needed in the future. ? ?Meds, vitals, and allergies reviewed.  ?ROS: Per HPI unless specifically indicated in ROS section  ? ?GEN: nad, alert and oriented ?HEENT: ncat ?NECK: supple w/o LA ?CV: rrr. ?PULM: ctab, no inc wob ?ABD: soft, +bs ?EXT: no edema ?SKIN: no acute rash ?

## 2021-09-04 NOTE — Patient Instructions (Signed)
Go to the lab on the way out.   If you have mychart we'll likely use that to update you.    ?Take care.  Glad to see you. ?Try to cut back on salt.  ?If your BP stays up (above 140/90), then start amlodipine and update me about 2 weeks after that, sooner if needed.  ?

## 2021-09-07 DIAGNOSIS — R030 Elevated blood-pressure reading, without diagnosis of hypertension: Secondary | ICD-10-CM | POA: Insufficient documentation

## 2021-09-07 NOTE — Assessment & Plan Note (Signed)
See notes on labs ?Advised to try to cut back on salt.  ?If BP stays up (above 140/90), then start amlodipine and update me about 2 weeks after that, sooner if needed.  She agrees with plan.  Routine cautions given to patient. ?

## 2021-09-11 DIAGNOSIS — H26492 Other secondary cataract, left eye: Secondary | ICD-10-CM | POA: Diagnosis not present

## 2021-09-11 DIAGNOSIS — Z961 Presence of intraocular lens: Secondary | ICD-10-CM | POA: Diagnosis not present

## 2021-09-11 DIAGNOSIS — H35362 Drusen (degenerative) of macula, left eye: Secondary | ICD-10-CM | POA: Diagnosis not present

## 2021-09-11 DIAGNOSIS — H401131 Primary open-angle glaucoma, bilateral, mild stage: Secondary | ICD-10-CM | POA: Diagnosis not present

## 2021-09-12 ENCOUNTER — Telehealth: Payer: Self-pay | Admitting: Family Medicine

## 2021-09-12 NOTE — Telephone Encounter (Signed)
Patient returning our call re: lab results Please give her a call

## 2021-09-12 NOTE — Telephone Encounter (Signed)
Called patient no answer and VM is full.

## 2021-09-15 NOTE — Telephone Encounter (Signed)
Patient notified of lab results and verbalized understanding.  ° °

## 2021-09-23 ENCOUNTER — Telehealth: Payer: Self-pay | Admitting: Family Medicine

## 2021-09-23 NOTE — Telephone Encounter (Signed)
Pt called and wanted to give Dr. Para March "an update on her blood pressure numbers over the past week. She also has 2 questions to ask him about a medication she did not specify which medication." She stated "she will not be in phone range until 11 am today." Please return a call back when possible, thanks.  Callback Number: [phone]

## 2021-09-24 NOTE — Telephone Encounter (Signed)
Called patient and she stated her BP has been all over the place. BP readings: 5/20 - 155/88 AM and 145/88 PM 5/21 - 152/92 AM and 138/83 PM 5/22 - 133/88 AM and 153/99 PM 5/23 - 163/97 AM and 131/78 PM 5/24 - 164/98 AM and 132/80 PM 5/25 - 163/95 AM 5/26 - 175/97 AM 5/27 - 165/99 AM and 152/86 PM 5/28 - 175/96 PM 5/29 - 175/99 AM and 149/92 PM 5/30 - 129/81 AM and 141/87 PM  Patient has not started her medication prescribed yet and will be leaving for the beach on Saturday. Patient wants to know what she should do.

## 2021-09-24 NOTE — Telephone Encounter (Signed)
Spoke with patient and and advised her to start the amlodipine and instructions given. Patient verbalized understanding.

## 2021-09-24 NOTE — Telephone Encounter (Signed)
I would start amlodipine 2.5mg  a day (1 tab) and update [place] is BP is consistently above 140/90 after about 2 weeks.  If lightheaded med with med use, then cut dose if half and see if tolerated.  Thanks.

## 2021-10-07 DIAGNOSIS — R3915 Urgency of urination: Secondary | ICD-10-CM | POA: Diagnosis not present

## 2021-10-07 DIAGNOSIS — R82998 Other abnormal findings in urine: Secondary | ICD-10-CM | POA: Diagnosis not present

## 2021-10-07 DIAGNOSIS — R350 Frequency of micturition: Secondary | ICD-10-CM | POA: Diagnosis not present

## 2021-10-07 DIAGNOSIS — R8290 Unspecified abnormal findings in urine: Secondary | ICD-10-CM | POA: Diagnosis not present

## 2021-11-20 DIAGNOSIS — R350 Frequency of micturition: Secondary | ICD-10-CM | POA: Diagnosis not present

## 2021-11-20 DIAGNOSIS — R8290 Unspecified abnormal findings in urine: Secondary | ICD-10-CM | POA: Diagnosis not present

## 2021-12-11 ENCOUNTER — Ambulatory Visit (INDEPENDENT_AMBULATORY_CARE_PROVIDER_SITE_OTHER): Payer: Medicare Other | Admitting: *Deleted

## 2021-12-11 DIAGNOSIS — Z1231 Encounter for screening mammogram for malignant neoplasm of breast: Secondary | ICD-10-CM

## 2021-12-11 DIAGNOSIS — Z0000 Encounter for general adult medical examination without abnormal findings: Secondary | ICD-10-CM | POA: Diagnosis not present

## 2021-12-11 NOTE — Patient Instructions (Signed)
Denise Norris , Thank you for taking time to come for your Medicare Wellness Visit. I appreciate your ongoing commitment to your health goals. Please review the following plan we discussed and let me know if I can assist you in the future.   Screening recommendations/referrals: Colonoscopy: up to date Mammogram: Education provided Bone Density: declined Recommended yearly ophthalmology/optometry visit for glaucoma screening and checkup Recommended yearly dental visit for hygiene and checkup  Vaccinations: Influenza vaccine: declined Pneumococcal vaccine: declined Tdap vaccine: declined Shingles vaccine: declined    Advanced directives: yes  Next appointment:    Preventive Care 65 Years and Older, Female Preventive care refers to lifestyle choices and visits with your health care provider that can promote health and wellness. What does preventive care include? A yearly physical exam. This is also called an annual well check. Dental exams once or twice a year. Routine eye exams. Ask your health care provider how often you should have your eyes checked. Personal lifestyle choices, including: Daily care of your teeth and gums. Regular physical activity. Eating a healthy diet. Avoiding tobacco and drug use. Limiting alcohol use. Practicing safe sex. Taking low-dose aspirin every day. Taking vitamin and mineral supplements as recommended by your health care provider. What happens during an annual well check? The services and screenings done by your health care provider during your annual well check will depend on your age, overall health, lifestyle risk factors, and family history of disease. Counseling  Your health care provider may ask you questions about your: Alcohol use. Tobacco use. Drug use. Emotional well-being. Home and relationship well-being. Sexual activity. Eating habits. History of falls. Memory and ability to understand (cognition). Work and work  Astronomer. Reproductive health. Screening  You may have the following tests or measurements: Height, weight, and BMI. Blood pressure. Lipid and cholesterol levels. These may be checked every 5 years, or more frequently if you are over 67 years old. Skin check. Lung cancer screening. You may have this screening every year starting at age 35 if you have a 30-pack-year history of smoking and currently smoke or have quit within the past 15 years. Fecal occult blood test (FOBT) of the stool. You may have this test every year starting at age 6. Flexible sigmoidoscopy or colonoscopy. You may have a sigmoidoscopy every 5 years or a colonoscopy every 10 years starting at age 51. Hepatitis C blood test. Hepatitis B blood test. Sexually transmitted disease (STD) testing. Diabetes screening. This is done by checking your blood sugar (glucose) after you have not eaten for a while (fasting). You may have this done every 1-3 years. Bone density scan. This is done to screen for osteoporosis. You may have this done starting at age 59. Mammogram. This may be done every 1-2 years. Talk to your health care provider about how often you should have regular mammograms. Talk with your health care provider about your test results, treatment options, and if necessary, the need for more tests. Vaccines  Your health care provider may recommend certain vaccines, such as: Influenza vaccine. This is recommended every year. Tetanus, diphtheria, and acellular pertussis (Tdap, Td) vaccine. You may need a Td booster every 10 years. Zoster vaccine. You may need this after age 32. Pneumococcal 13-valent conjugate (PCV13) vaccine. One dose is recommended after age 56. Pneumococcal polysaccharide (PPSV23) vaccine. One dose is recommended after age 31. Talk to your health care provider about which screenings and vaccines you need and how often you need them. This information is not intended  to replace advice given to you by  your health care provider. Make sure you discuss any questions you have with your health care provider. Document Released: 05/10/2015 Document Revised: 01/01/2016 Document Reviewed: 02/12/2015 Elsevier Interactive Patient Education  2017 [employer] Prevention in the Home Falls can cause injuries. They can happen to people of all ages. There are many things you can do to make your home safe and to help prevent falls. What can I do on the outside of my home? Regularly fix the edges of walkways and driveways and fix any cracks. Remove anything that might make you trip as you walk through a door, such as a raised step or threshold. Trim any bushes or trees on the path to your home. Use bright outdoor lighting. Clear any walking paths of anything that might make someone trip, such as rocks or tools. Regularly check to see if handrails are loose or broken. Make sure that both sides of any steps have handrails. Any raised decks and porches should have guardrails on the edges. Have any leaves, snow, or ice cleared regularly. Use sand or salt on walking paths during winter. Clean up any spills in your garage right away. This includes oil or grease spills. What can I do in the bathroom? Use night lights. Install grab bars by the toilet and in the tub and shower. Do not use towel bars as grab bars. Use non-skid mats or decals in the tub or shower. If you need to sit down in the shower, use a plastic, non-slip stool. Keep the floor dry. Clean up any water that spills on the floor as soon as it happens. Remove soap buildup in the tub or shower regularly. Attach bath mats securely with double-sided non-slip rug tape. Do not have throw rugs and other things on the floor that can make you trip. What can I do in the bedroom? Use night lights. Make sure that you have a light by your bed that is easy to reach. Do not use any sheets or blankets that are too big for your bed. They should not hang  down onto the floor. Have a firm chair that has side arms. You can use this for support while you get dressed. Do not have throw rugs and other things on the floor that can make you trip. What can I do in the kitchen? Clean up any spills right away. Avoid walking on wet floors. Keep items that you use a lot in easy-to-reach places. If you need to reach something above you, use a strong step stool that has a grab bar. Keep electrical cords out of the way. Do not use floor polish or wax that makes floors slippery. If you must use wax, use non-skid floor wax. Do not have throw rugs and other things on the floor that can make you trip. What can I do with my stairs? Do not leave any items on the stairs. Make sure that there are handrails on both sides of the stairs and use them. Fix handrails that are broken or loose. Make sure that handrails are as long as the stairways. Check any carpeting to make sure that it is firmly attached to the stairs. Fix any carpet that is loose or worn. Avoid having throw rugs at the top or bottom of the stairs. If you do have throw rugs, attach them to the floor with carpet tape. Make sure that you have a light switch at the top of the stairs and  the bottom of the stairs. If you do not have them, ask someone to add them for you. What else can I do to help prevent falls? Wear shoes that: Do not have high heels. Have rubber bottoms. Are comfortable and fit you well. Are closed at the toe. Do not wear sandals. If you use a stepladder: Make sure that it is fully opened. Do not climb a closed stepladder. Make sure that both sides of the stepladder are locked into place. Ask someone to hold it for you, if possible. Clearly mark and make sure that you can see: Any grab bars or handrails. First and last steps. Where the edge of each step is. Use tools that help you move around (mobility aids) if they are needed. These  include: Canes. Walkers. Scooters. Crutches. Turn on the lights when you go into a dark area. Replace any light bulbs as soon as they burn out. Set up your furniture so you have a clear path. Avoid moving your furniture around. If any of your floors are uneven, fix them. If there are any pets around you, be aware of where they are. Review your medicines with your doctor. Some medicines can make you feel dizzy. This can increase your chance of falling. Ask your doctor what other things that you can do to help prevent falls. This information is not intended to replace advice given to you by your health care provider. Make sure you discuss any questions you have with your health care provider. Document Released: 02/07/2009 Document Revised: 09/19/2015 Document Reviewed: 05/18/2014 Elsevier Interactive Patient Education  2017 [employer].

## 2021-12-11 NOTE — Progress Notes (Signed)
Subjective:   Denise Norris is a 71 y.o. female who presents for Medicare Annual (Subsequent) preventive examination.  I connected with  Denise Norris on 12/11/21 by a telephone enabled telemedicine application and verified that I am speaking with the correct person using two identifiers.   I discussed the limitations of evaluation and management by telemedicine. The patient expressed understanding and agreed to proceed.  Patient location: home  Provider location: Tele-Health-home   Review of Systems     Cardiac Risk Factors include: advanced age (>22men, >8 women);hypertension;obesity (BMI >30kg/m2);sedentary lifestyle     Objective:    Today's Vitals   There is no height or weight on file to calculate BMI.     12/11/2021   10:35 AM 09/01/2021    7:36 AM 10/11/2019    3:00 PM 10/11/2019   10:06 AM 09/29/2019    3:49 PM 08/17/2019    3:39 PM 02/01/2019    5:00 PM  Advanced Directives  Does Patient Have a Medical Advance Directive? Yes Yes No No Yes Yes Yes  Type of Sales promotion account executive of [place];Living will Healthcare Power of [place];Living will  Healthcare Power of [place];Living will Healthcare Power of [place];Living will Healthcare Power of Attorney  Does patient want to make changes to medical advance directive?  No - Patient declined No - Patient declined No - Patient declined No - Patient declined  No - Patient declined  Copy of Healthcare Power of Attorney in Chart? Yes - validated most recent copy scanned in chart (See row information) No - copy requested No - copy requested  No - copy requested No - copy requested No - copy requested  Would patient like information on creating a medical advance directive?   No - Patient declined No - Patient declined       Current Medications (verified) Outpatient Encounter Medications as of 12/11/2021  Medication Sig   acetaminophen (TYLENOL) 500 MG tablet Take 1,000 mg by mouth in the  morning and at bedtime.   amLODipine (NORVASC) 2.5 MG tablet Take 1 tablet (2.5 mg total) by mouth daily.   famotidine (PEPCID) 20 MG tablet Take 20 mg by mouth 2 (two) times daily as needed for heartburn or indigestion.   timolol (TIMOPTIC) 0.5 % ophthalmic solution Place 1 drop into both eyes 2 (two) times daily.   No facility-administered encounter medications on file as of 12/11/2021.    Allergies (verified) Codeine   History: Past Medical History:  Diagnosis Date   Arthritis    Connective tissue disorder (HCC)    Unspecified, previously followed by [facility].   GERD (gastroesophageal reflux disease)    Glaucoma    Migraine    Past Surgical History:  Procedure Laterality Date   BACK SURGERY     CARPAL TUNNEL RELEASE Bilateral    CATARACT EXTRACTION Bilateral    CERVICAL SPINE SURGERY     C5/C6 repair.  Previous right arm radiculopathy resolved with surgery.   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   Procedure: TOTAL KNEE ARTHROPLASTY;  Surgeon: [name], MD;  Location: [facility];  Service: Orthopedics;  Laterality: Right;   TOTAL KNEE ARTHROPLASTY  Left 10/11/2019   Procedure: TOTAL KNEE ARTHROPLASTY;  Surgeon: [name], MD;  Location: [facility];  Service: Orthopedics;  Laterality: Left;   Family History  Problem Relation Age of Onset   Cancer Mother        lung cancer   Breast cancer Sister    Colon cancer Neg Hx    Social History   Socioeconomic History   Marital status: Widowed    Spouse name: Not on file   Number of children: Not on file   Years of education: Not on file   Highest education level: Not on file  Occupational History   Not on  file  Tobacco Use   Smoking status: Never   Smokeless tobacco: Never  Vaping Use   Vaping Use: Never used  Substance and Sexual Activity   Alcohol use: Never   Drug use: Never   Sexual activity: Not on file  Other Topics Concern   Not on file  Social History Narrative   Married 1970-08-12.   Undergraduate degree at [employer] in [place].   Masters degree at [employer] (husband had memory loss)   Retired Nature conservation officer.  Previously taught fourth through sixth grade   As of 2019/08/12 she splits time at her house and their son's house.      As of 08/12/2019 has 1 living grandson.  Her granddaughter died in childhood 12-Aug-2018   Social Determinants of Health   Financial Resource Strain: Low Risk  (12/11/2021)   Overall Financial Resource Strain (CARDIA)    Difficulty of Paying Living Expenses: Not hard at all  Food Insecurity: No Food Insecurity (08/17/2019)   Hunger Vital Sign    Worried About Running Out of Food in the Last Year: Never true    Ran Out of Food in the Last Year: Never true  Transportation Needs: No Transportation Needs (12/11/2021)   PRAPARE - Administrator, Civil Service (Medical): No    Lack of Transportation (Non-Medical): No  Physical Activity: Inactive (12/11/2021)   Exercise Vital Sign    Days of Exercise per Week: 0 days    Minutes of Exercise per Session: 0 min  Stress: No Stress Concern Present (12/11/2021)   [employer] of Occupational Health - Occupational Stress Questionnaire    Feeling of Stress : Not at all  Social Connections: Moderately Isolated (12/11/2021)   Social Connection and Isolation Panel [NHANES]    Frequency of Communication with Friends and Family: More than three times a week    Frequency of Social Gatherings with Friends and Family: Three times a week    Attends Religious Services: More than 4 times per year    Active Member of Clubs or Organizations: No    Attends Banker Meetings: Never     Marital Status: Widowed    Tobacco Counseling Counseling given: Not Answered   Clinical Intake:  Pre-visit preparation completed: Yes  Pain : No/denies pain     Diabetes: No  How often do you need to have someone help you when you read instructions, pamphlets, or other written materials from your doctor or pharmacy?: 1 - Never  Diabetic?  no  Interpreter Needed?: No  Information entered by :: Remi Haggard LPN   Activities of Daily Living    12/11/2021   10:41 AM 09/01/2021    7:30 AM  In your present state of health, do you have any difficulty performing the following activities:  Hearing? 0 0  Vision? 0 0  Difficulty concentrating or making decisions? 0 0  Walking or climbing stairs? 1 0  Comment goes very slow   Dressing or bathing? 0 0  Doing errands, shopping? 0   Preparing Food and eating ? N   Using the Toilet? N   In the past six months, have you accidently leaked urine? Y   Do you have problems with loss of bowel control? N   Managing your Medications? N   Managing your Finances? N   Housekeeping or managing your Housekeeping? N     Patient Care Team: Joaquim Nam, MD as PCP - General (Family Medicine) [name], MD as Consulting Physician (Orthopedic Surgery)  Indicate any recent Medical Services you may have received from other than [facility] providers in the past year (date may be approximate).     Assessment:   This is a routine wellness examination for Noreen.  Hearing/Vision screen Hearing Screening - Comments:: No trouble hearing Vision Screening - Comments:: Odis Luster Up to date  Dietary issues and exercise activities discussed: Current Exercise Habits: The patient does not participate in regular exercise at present (is planning on getting back to gym), Exercise limited by: orthopedic condition(s)   Goals Addressed             This Visit's Progress    Increase physical activity       Loose weight       Depression Screen     12/11/2021   10:41 AM 08/17/2019    3:41 PM 03/10/2018   12:50 PM  PHQ 2/9 Scores  PHQ - 2 Score 0 0 0  PHQ- 9 Score  0     Fall Risk    12/11/2021   10:39 AM 08/17/2019    3:40 PM  Fall Risk   Falls in the past year? 0 1  Comment  has knee pain  Number falls in past yr: 1 0  Injury with Fall? 0 0  Risk for fall due to :  Impaired mobility  Follow up Falls evaluation completed;Falls prevention discussed;Education provided Falls evaluation completed;Falls prevention discussed    FALL RISK PREVENTION PERTAINING TO THE HOME:  Any stairs in or around the home? No  If so, are there any without handrails? No  Home free of loose throw rugs in walkways, pet beds, electrical cords, etc? Yes  Adequate lighting in your home to reduce risk of falls? Yes   ASSISTIVE DEVICES UTILIZED TO PREVENT FALLS:  Life alert? No  Use of a cane, walker or w/c? No  Grab bars in the bathroom? Yes  Shower chair or bench in shower? No  Elevated toilet seat or a handicapped toilet? No   TIMED UP AND GO:  Was the test performed? No .    Cognitive Function:    08/17/2019    3:43 PM  MMSE - Mini Mental State Exam  Orientation to time 5  Orientation to Place 5  Registration 3  Attention/ Calculation 5  Recall 3  Language- repeat 1        12/11/2021   10:37 AM  6CIT Screen  What Year? 0 points  What time? 0 points  Count back from 20 0 points  Months in reverse 0 points  Repeat phrase 0 points    Immunizations  There is no immunization history on file for this patient.  TDAP status: Due, Education has been provided regarding the importance of this vaccine. Advised may receive this vaccine at local  pharmacy or Health Dept. Aware to provide a copy of the vaccination record if obtained from local pharmacy or Health Dept. Verbalized acceptance and understanding.  Flu Vaccine status: Declined, Education has been provided regarding the importance of this vaccine but patient still declined.  Advised may receive this vaccine at local pharmacy or Health Dept. Aware to provide a copy of the vaccination record if obtained from local pharmacy or Health Dept. Verbalized acceptance and understanding.  Pneumococcal vaccine status: Declined,  Education has been provided regarding the importance of this vaccine but patient still declined. Advised may receive this vaccine at local pharmacy or Health Dept. Aware to provide a copy of the vaccination record if obtained from local pharmacy or Health Dept. Verbalized acceptance and understanding.   Covid-19 vaccine status: Declined, Education has been provided regarding the importance of this vaccine but patient still declined. Advised may receive this vaccine at local pharmacy or Health Dept.or vaccine clinic. Aware to provide a copy of the vaccination record if obtained from local pharmacy or Health Dept. Verbalized acceptance and understanding.  Qualifies for Shingles Vaccine? Yes   Zostavax completed No   Shingrix Completed?: No.    Education has been provided regarding the importance of this vaccine. Patient has been advised to call insurance company to determine out of pocket expense if they have not yet received this vaccine. Advised may also receive vaccine at local pharmacy or Health Dept. Verbalized acceptance and understanding.  Screening Tests Health Maintenance  Topic Date Due   MAMMOGRAM  05/28/2021   INFLUENZA VACCINE  11/25/2021   COVID-19 Vaccine (1) 12/27/2021 (Originally 01/22/1951)   Zoster Vaccines- Shingrix (1 of 2) 03/13/2022 (Originally 07/21/2000)   Pneumonia Vaccine 5365+ Years old (1 - PCV) 12/12/2022 (Originally 07/22/2015)   DEXA SCAN  08/17/2023 (Originally 07/22/2015)   TETANUS/TDAP  08/17/2023 (Originally 07/21/1969)   COLONOSCOPY (Pts 45-6961yrs Insurance coverage will need to be confirmed)  09/02/2031   Hepatitis C Screening  Completed   HPV VACCINES  Aged Out    Health Maintenance  Health Maintenance Due  Topic Date  Due   MAMMOGRAM  05/28/2021   INFLUENZA VACCINE  11/25/2021    Colorectal cancer screening: Type of screening: Colonoscopy. Completed 2023. Repeat every never years  Mammogram status: Ordered  . Pt provided with contact info and advised to call to schedule appt.   Bone Density  declined  Lung Cancer Screening: (Low Dose CT Chest recommended if Age 36-80 years, 30 pack-year currently smoking OR have quit w/in 15years.) does not qualify.   Lung Cancer Screening Referral:   Additional Screening:  Hepatitis C Screening: does not qualify; Completed 2011  Vision Screening: Recommended annual ophthalmology exams for early detection of glaucoma and other disorders of the eye. Is the patient up to date with their annual eye exam?  Yes  Who is the provider or what is the name of the office in which the patient attends annual eye exams? [facility] If pt is not established with a provider, would they like to be referred to a provider to establish care? No .   Dental Screening: Recommended annual dental exams for proper oral hygiene  Community Resource Referral / Chronic Care Management: CRR required this visit?  No   CCM required this visit?  No      Plan:     I have personally reviewed and noted the following in the patient's chart:   Medical and social history Use of alcohol, tobacco or illicit drugs  Current medications  and supplements including opioid prescriptions.  Functional ability and status Nutritional status Physical activity Advanced directives List of other physicians Hospitalizations, surgeries, and ER visits in previous 12 months Vitals Screenings to include cognitive, depression, and falls Referrals and appointments  In addition, I have reviewed and discussed with patient certain preventive protocols, quality metrics, and best practice recommendations. A written personalized care plan for preventive services as well as general preventive health recommendations were  provided to patient.     Remi Haggard, LPN   [id number]   Nurse Notes:

## 2022-03-16 ENCOUNTER — Telehealth: Payer: Self-pay | Admitting: Family Medicine

## 2022-03-16 DIAGNOSIS — H401131 Primary open-angle glaucoma, bilateral, mild stage: Secondary | ICD-10-CM | POA: Diagnosis not present

## 2022-03-16 NOTE — Telephone Encounter (Signed)
Caller Name: Enslie Call back phone #: [phone]  MEDICATION(S):  amLODipine (NORVASC) 2.5 MG tablet   Days of Med Remaining: 12  Has the patient contacted their pharmacy (YES/NO)? NO What did pharmacy advise?   Preferred Pharmacy:  [facility] [address]   ~~~Please advise patient/caregiver to allow 2-3 business days to process RX refills.

## 2022-03-17 MED ORDER — AMLODIPINE BESYLATE 2.5 MG PO TABS: 2.5000 mg | ORAL_TABLET | Freq: Every day | ORAL | 0 refills | Status: DC

## 2022-03-17 NOTE — Telephone Encounter (Signed)
Please call and schedule CPE with fasting labs prior with Dr. Para March.  Already had her MWV with nurse.

## 2022-03-17 NOTE — Telephone Encounter (Signed)
LVM for patient tcb and schedule 

## 2022-03-23 NOTE — Telephone Encounter (Signed)
Pt schedule CPE needs RX refill

## 2022-03-23 NOTE — Telephone Encounter (Signed)
Rx was refilled on 03/17/22

## 2022-03-31 ENCOUNTER — Encounter: Payer: Self-pay | Admitting: Family Medicine

## 2022-03-31 ENCOUNTER — Ambulatory Visit (INDEPENDENT_AMBULATORY_CARE_PROVIDER_SITE_OTHER): Payer: Medicare Other | Admitting: Family Medicine

## 2022-03-31 VITALS — BP 140/70 | HR 77 | Temp 97.8°F | Ht 62.0 in | Wt 173.0 lb

## 2022-03-31 DIAGNOSIS — I10 Essential (primary) hypertension: Secondary | ICD-10-CM | POA: Diagnosis not present

## 2022-03-31 DIAGNOSIS — Z7189 Other specified counseling: Secondary | ICD-10-CM | POA: Diagnosis not present

## 2022-03-31 DIAGNOSIS — Z0000 Encounter for general adult medical examination without abnormal findings: Secondary | ICD-10-CM

## 2022-03-31 MED ORDER — AMLODIPINE BESYLATE 2.5 MG PO TABS: 2.5000 mg | ORAL_TABLET | Freq: Every day | ORAL | 3 refills | Status: DC

## 2022-03-31 NOTE — Progress Notes (Unsigned)
Hypertension:    Using medication without problems or lightheadedness: yes Chest pain with exertion:no Edema:no change from baseline.   Short of breath:no Not lightheaded but occ balance change.  No falls. Not stumbling.   Recent macular changes d/w pt.  Will have eye clinic f/u    Son Barbara Cower designated if patient were incapacitated. Mammogram d/w pt.  She is going to f/u in [place].  Had mammogram 2023 at gyn clinic.   D/w pt about getting DXA done with next mammogram.   Diet and exercise d/w pt.  Vaccination d/w pt. routine vaccines encouraged.  Meds, vitals, and allergies reviewed.   PMH and SH reviewed  ROS: Per HPI unless specifically indicated in ROS section   GEN: nad, alert and oriented HEENT: mucous membranes moist NECK: supple w/o LA CV: rrr. PULM: ctab, no inc wob ABD: soft, +bs EXT: no edema SKIN: no acute rash Dix-Hallpike negative. CN 2-12 wnl B, S/S wnl x4  30 minutes were devoted to patient care in this encounter (this includes time spent reviewing the patient's file/history, interviewing and examining the patient, counseling/reviewing plan with patient).

## 2022-03-31 NOTE — Patient Instructions (Addendum)
I'll work on your mammogram and bone density test for 05/2022.   The off balance sensation could be from vertigo.  The bedside exercise could help.  I don't think this is from your blood pressure/medicine.  Update me as needed.  Take care.  Glad to see you.

## 2022-04-01 NOTE — Assessment & Plan Note (Signed)
Son Barbara Cower designated if patient were incapacitated. Mammogram d/w pt.  She is going to f/u in [place].  Had mammogram 2023 at gyn clinic.   D/w pt about getting DXA done with next mammogram.   Diet and exercise d/w pt.  Vaccination d/w pt. routine vaccines encouraged.

## 2022-04-01 NOTE — Assessment & Plan Note (Signed)
Son Barbara Cower designated if patient were incapacitated.

## 2022-04-01 NOTE — Assessment & Plan Note (Signed)
Continue amlodipine.  Blood pressure controlled.  She is not lightheaded but she has had occasional balance changes.  I question if this could be BPV.  Dix-Hallpike negative at time of exam.  Asymptomatic at time of exam.  Discussed home exercises and pathophysiology.  She can update me if she continues to have symptoms.  Previous labs discussed with patient.

## 2022-06-21 ENCOUNTER — Other Ambulatory Visit: Payer: Self-pay | Admitting: Family Medicine

## 2022-06-21 DIAGNOSIS — E2839 Other primary ovarian failure: Secondary | ICD-10-CM

## 2022-06-21 DIAGNOSIS — Z780 Asymptomatic menopausal state: Secondary | ICD-10-CM

## 2022-06-21 DIAGNOSIS — Z1231 Encounter for screening mammogram for malignant neoplasm of breast: Secondary | ICD-10-CM

## 2022-06-21 NOTE — Progress Notes (Signed)
Please call patient.  I put in the order for her mammogram and bone density test.  Please have her call the [facility] [phone]  She should be able to get scheduled for both on the same day.  Thanks.

## 2022-06-22 NOTE — Progress Notes (Signed)
Called patient and gave her the information to schedule her appt with the [facility].

## 2022-07-28 ENCOUNTER — Other Ambulatory Visit: Payer: Medicare Other

## 2022-07-28 ENCOUNTER — Ambulatory Visit: Payer: Medicare Other

## 2022-08-31 LAB — HM DEXA SCAN

## 2022-09-14 DIAGNOSIS — H35362 Drusen (degenerative) of macula, left eye: Secondary | ICD-10-CM | POA: Diagnosis not present

## 2022-09-14 DIAGNOSIS — Z961 Presence of intraocular lens: Secondary | ICD-10-CM | POA: Diagnosis not present

## 2022-09-14 DIAGNOSIS — H353131 Nonexudative age-related macular degeneration, bilateral, early dry stage: Secondary | ICD-10-CM | POA: Diagnosis not present

## 2022-09-14 DIAGNOSIS — H401131 Primary open-angle glaucoma, bilateral, mild stage: Secondary | ICD-10-CM | POA: Diagnosis not present

## 2022-09-14 DIAGNOSIS — Z1231 Encounter for screening mammogram for malignant neoplasm of breast: Secondary | ICD-10-CM | POA: Diagnosis not present

## 2022-09-14 LAB — HM MAMMOGRAPHY

## 2022-10-06 ENCOUNTER — Encounter: Payer: Self-pay | Admitting: Gynecology

## 2022-10-07 ENCOUNTER — Other Ambulatory Visit: Payer: Self-pay | Admitting: Family Medicine

## 2022-10-07 ENCOUNTER — Encounter: Payer: Self-pay | Admitting: Family Medicine

## 2022-10-07 DIAGNOSIS — M8580 Other specified disorders of bone density and structure, unspecified site: Secondary | ICD-10-CM

## 2022-10-07 DIAGNOSIS — I10 Essential (primary) hypertension: Secondary | ICD-10-CM

## 2022-10-07 DIAGNOSIS — M8588 Other specified disorders of bone density and structure, other site: Secondary | ICD-10-CM

## 2022-10-08 ENCOUNTER — Ambulatory Visit (INDEPENDENT_AMBULATORY_CARE_PROVIDER_SITE_OTHER): Payer: Medicare Other | Admitting: Family Medicine

## 2022-10-08 ENCOUNTER — Encounter: Payer: Self-pay | Admitting: Family Medicine

## 2022-10-08 VITALS — BP 136/80 | HR 76 | Temp 97.8°F | Ht 62.0 in | Wt 175.0 lb

## 2022-10-08 DIAGNOSIS — H6120 Impacted cerumen, unspecified ear: Secondary | ICD-10-CM

## 2022-10-08 DIAGNOSIS — M8580 Other specified disorders of bone density and structure, unspecified site: Secondary | ICD-10-CM

## 2022-10-08 DIAGNOSIS — M8588 Other specified disorders of bone density and structure, other site: Secondary | ICD-10-CM | POA: Diagnosis not present

## 2022-10-08 DIAGNOSIS — I10 Essential (primary) hypertension: Secondary | ICD-10-CM

## 2022-10-08 DIAGNOSIS — H409 Unspecified glaucoma: Secondary | ICD-10-CM | POA: Insufficient documentation

## 2022-10-08 LAB — CBC WITH DIFFERENTIAL/PLATELET
Basophils Absolute: 0 10*3/uL (ref 0.0–0.1)
Basophils Relative: 0.9 % (ref 0.0–3.0)
Eosinophils Absolute: 0.1 10*3/uL (ref 0.0–0.7)
Eosinophils Relative: 2.6 % (ref 0.0–5.0)
HCT: 34.7 % — ABNORMAL LOW (ref 36.0–46.0)
Hemoglobin: 11.3 g/dL — ABNORMAL LOW (ref 12.0–15.0)
Lymphocytes Relative: 30.4 % (ref 12.0–46.0)
Lymphs Abs: 1.5 10*3/uL (ref 0.7–4.0)
MCHC: 32.5 g/dL (ref 30.0–36.0)
MCV: 93.5 fl (ref 78.0–100.0)
Monocytes Absolute: 0.7 10*3/uL (ref 0.1–1.0)
Monocytes Relative: 14.4 % — ABNORMAL HIGH (ref 3.0–12.0)
Neutro Abs: 2.5 10*3/uL (ref 1.4–7.7)
Neutrophils Relative %: 51.7 % (ref 43.0–77.0)
Platelets: 224 10*3/uL (ref 150.0–400.0)
RBC: 3.71 Mil/uL — ABNORMAL LOW (ref 3.87–5.11)
RDW: 13.9 % (ref 11.5–15.5)
WBC: 4.8 10*3/uL (ref 4.0–10.5)

## 2022-10-08 LAB — COMPREHENSIVE METABOLIC PANEL
ALT: 15 U/L (ref 0–35)
AST: 20 U/L (ref 0–37)
Albumin: 3.7 g/dL (ref 3.5–5.2)
Alkaline Phosphatase: 70 U/L (ref 39–117)
BUN: 23 mg/dL (ref 6–23)
CO2: 29 mEq/L (ref 19–32)
Calcium: 8.8 mg/dL (ref 8.4–10.5)
Chloride: 104 mEq/L (ref 96–112)
Creatinine, Ser: 0.62 mg/dL (ref 0.40–1.20)
GFR: 89.1 mL/min (ref >60.00)
Glucose, Bld: 84 mg/dL (ref 70–99)
Potassium: 4.1 mEq/L (ref 3.5–5.1)
Sodium: 140 mEq/L (ref 135–145)
Total Bilirubin: 0.3 mg/dL (ref 0.2–1.2)
Total Protein: 6.9 g/dL (ref 6.0–8.3)

## 2022-10-08 LAB — LIPID PANEL
Cholesterol: 186 mg/dL (ref 0–200)
HDL: 56.9 mg/dL (ref >39.00)
LDL Cholesterol: 114 mg/dL — ABNORMAL HIGH (ref 0–99)
NonHDL: 128.71
Total CHOL/HDL Ratio: 3
Triglycerides: 73 mg/dL (ref 0.0–149.0)
VLDL: 14.6 mg/dL (ref 0.0–40.0)

## 2022-10-08 LAB — VITAMIN D 25 HYDROXY (VIT D DEFICIENCY, FRACTURES): VITD: 26.49 ng/mL — ABNORMAL LOW (ref 30.00–100.00)

## 2022-10-08 NOTE — Patient Instructions (Signed)
Gently irrigate in the shower.  If you have another episode, you can use debrox then rinse out.  Update [place] as needed.  Take care.  Glad to see you.

## 2022-10-08 NOTE — Progress Notes (Signed)
D/w pt about DXA results, fracture risk, and rationale for calcium and vit D.  Recheck vit D pending.  See notes on labs.  Due for f/u labs re: HTN.  BP controlled.   Recent mammogram wnl.   R ear muffled.  No pain.  No L sided sx.  No FCNAVD. Going on a few weeks.    Meds, vitals, and allergies reviewed.   ROS: Per HPI unless specifically indicated in ROS section   Nad Ncat B cerumen, R>L, removed with curette B.  She consented.  Tolerated well.  Recheck normal canals and TMs B Neck supple, no LA  30 minutes were devoted to patient care in this encounter (this includes time spent reviewing the patient's file/history, interviewing and examining the patient, counseling/reviewing plan with patient).

## 2022-10-11 ENCOUNTER — Other Ambulatory Visit: Payer: Self-pay | Admitting: Family Medicine

## 2022-10-11 DIAGNOSIS — E559 Vitamin D deficiency, unspecified: Secondary | ICD-10-CM

## 2022-10-11 DIAGNOSIS — H6120 Impacted cerumen, unspecified ear: Secondary | ICD-10-CM | POA: Insufficient documentation

## 2022-10-11 MED ORDER — VITAMIN D (CHOLECALCIFEROL) 25 MCG (1000 UT) PO CAPS: 2000.0000 [IU] | ORAL_CAPSULE | Freq: Every day | ORAL | Status: AC

## 2022-10-11 NOTE — Assessment & Plan Note (Signed)
D/w pt about DXA and rationale for calcium and vit D.  Recheck vit D pending.  See notes on labs.

## 2022-10-11 NOTE — Assessment & Plan Note (Signed)
Resolved with curette after she consented.  Hearing clearly improved after the fact.  Tolerated well.  Recheck canal and tympanic membrane normal bilaterally.  Can use Debrox in the future at home if needed.

## 2022-10-11 NOTE — Assessment & Plan Note (Signed)
See notes on labs. 

## 2022-11-11 DIAGNOSIS — R350 Frequency of micturition: Secondary | ICD-10-CM | POA: Diagnosis not present

## 2022-11-12 DIAGNOSIS — R300 Dysuria: Secondary | ICD-10-CM | POA: Diagnosis not present

## 2022-12-16 ENCOUNTER — Other Ambulatory Visit: Payer: Medicare Other

## 2022-12-16 ENCOUNTER — Ambulatory Visit: Payer: Medicare Other

## 2023-01-11 DIAGNOSIS — R300 Dysuria: Secondary | ICD-10-CM | POA: Diagnosis not present

## 2023-02-16 ENCOUNTER — Ambulatory Visit (INDEPENDENT_AMBULATORY_CARE_PROVIDER_SITE_OTHER): Payer: Medicare Other

## 2023-02-16 VITALS — Wt 175.0 lb

## 2023-02-16 DIAGNOSIS — Z0000 Encounter for general adult medical examination without abnormal findings: Secondary | ICD-10-CM | POA: Diagnosis not present

## 2023-02-16 DIAGNOSIS — R300 Dysuria: Secondary | ICD-10-CM | POA: Diagnosis not present

## 2023-02-16 NOTE — Progress Notes (Signed)
Subjective:   Denise Norris is a 72 y.o. female who presents for Medicare Annual (Subsequent) preventive examination.  Visit Complete: Virtual I connected with  [name] on 02/16/23 by a audio enabled telemedicine application and verified that I am speaking with the correct person using two identifiers.  Patient Location: Home  Provider Location: Office/Clinic  I discussed the limitations of evaluation and management by telemedicine. The patient expressed understanding and agreed to proceed.  Vital Signs: Because this visit was a virtual/telehealth visit, some criteria may be missing or patient reported. Any vitals not documented were not able to be obtained and vitals that have been documented are patient reported.  Patient Medicare AWV questionnaire was completed by the patient on 02/15/23; I have confirmed that all information answered by patient is correct and no changes since this date.  Cardiac Risk Factors include: advanced age (>36men, >24 women);hypertension;obesity (BMI >30kg/m2)     Objective:    Today's Vitals   02/16/23 1407  Weight: 175 lb (79.4 kg)   Body mass index is 32.01 kg/m.     02/16/2023    2:15 PM 12/11/2021   10:35 AM 09/01/2021    7:36 AM 10/11/2019    3:00 PM 10/11/2019   10:06 AM 09/29/2019    3:49 PM 08/17/2019    3:39 PM  Advanced Directives  Does Patient Have a Medical Advance Directive? Yes Yes Yes No No Yes Yes  Type of Estate agent of [place];Living will Healthcare Power of [employer] of [place];Living will Healthcare Power of [place];Living will  Healthcare Power of [place];Living will Healthcare Power of [place];Living will  Does patient want to make changes to medical advance directive?   No - Patient declined No - Patient declined No - Patient declined No - Patient declined   Copy of Healthcare Power of Attorney in Chart? No - copy requested Yes - validated most recent copy scanned in chart (See  row information) No - copy requested No - copy requested  No - copy requested No - copy requested  Would patient like information on creating a medical advance directive?    No - Patient declined No - Patient declined      Current Medications (verified) Outpatient Encounter Medications as of 02/16/2023  Medication Sig   acetaminophen (TYLENOL) 500 MG tablet Take 1,000 mg by mouth in the morning and at bedtime.   amLODipine (NORVASC) 2.5 MG tablet Take 1 tablet (2.5 mg total) by mouth daily.   BACTRIM DS 800-160 MG tablet Take 1 tablet every 12 hours by oral route for 7 days.   Calcium Carbonate-Vit D-Min (CALCIUM 1200 PO) Take by mouth.   Docusate Sodium (COLACE PO) Take by mouth.   famotidine (PEPCID) 20 MG tablet Take 20 mg by mouth 2 (two) times daily as needed for heartburn or indigestion.   GLUCOSAMINE-CHONDROITIN-MSM PO Take by mouth.   Multiple Vitamin (MULTIVITAMIN ADULT PO) Take by mouth.   Multiple Vitamins-Minerals (ICAPS AREDS 2 PO) Take 1 tablet by mouth in the morning and at bedtime.   timolol (TIMOPTIC) 0.5 % ophthalmic solution Place 1 drop into both eyes 2 (two) times daily.   Vitamin D, Cholecalciferol, 25 MCG (1000 UT) CAPS Take 2,000 Int'l Units by mouth daily.   vitamin k 100 MCG tablet Take 100 mcg by mouth daily.   No facility-administered encounter medications on file as of 02/16/2023.    Allergies (verified) Codeine   History: Past Medical History:  Diagnosis Date  Arthritis    Connective tissue disorder (HCC)    Unspecified, previously followed by [facility].   GERD (gastroesophageal reflux disease)    Glaucoma    Migraine    Past Surgical History:  Procedure Laterality Date   BACK SURGERY     CARPAL TUNNEL RELEASE Bilateral    CATARACT EXTRACTION Bilateral    CERVICAL SPINE SURGERY     C5/C6 repair.  Previous right arm radiculopathy resolved with surgery.   COLONOSCOPY N/A 09/01/2021   Procedure: COLONOSCOPY;  Surgeon: Midge Minium, MD;   Location: [facility];  Service: Endoscopy;  Laterality: N/A;   JOINT REPLACEMENT     POLYPECTOMY  09/01/2021   Procedure: POLYPECTOMY;  Surgeon: Midge Minium, MD;  Location: [facility];  Service: Endoscopy;;   TOTAL KNEE ARTHROPLASTY Right 02/01/2019   Procedure: TOTAL KNEE ARTHROPLASTY;  Surgeon: [name], MD;  Location: [facility];  Service: Orthopedics;  Laterality: Right;   TOTAL KNEE ARTHROPLASTY Left 10/11/2019   Procedure: TOTAL KNEE ARTHROPLASTY;  Surgeon: [name], MD;  Location: [facility];  Service: Orthopedics;  Laterality: Left;   Family History  Problem Relation Age of Onset   Cancer Mother        lung cancer   Breast cancer Sister    Colon cancer Paternal Grandfather        possible   Social History   Socioeconomic History   Marital status: Widowed    Spouse name: Not on file   Number of children: Not on file   Years of education: Not on file   Highest education level: Master's degree (e.g., MA, MS, MEng, MEd, MSW, MBA)  Occupational History   Not on file  Tobacco Use   Smoking status: Never   Smokeless tobacco: Never  Vaping Use   Vaping status: Never Used  Substance and Sexual Activity   Alcohol use: Never   Drug use: Never   Sexual activity: Not on file  Other Topics Concern   Not on file  Social History Narrative   Married March 11, 1971.   Undergraduate degree at [employer] in [place].   Masters degree at [employer] (husband had memory loss)   Retired Nature conservation officer.  Previously taught fourth through sixth grade   As of 03-10-20 she splits time at her house and their son's house.      As of 03/10/20 has 1 living grandson.  Her granddaughter died in childhood 03-11-19.   Social Determinants of Health   Financial Resource Strain: Low Risk  (02/15/2023)   Overall Financial Resource Strain (CARDIA)    Difficulty of Paying Living Expenses: Not hard at all  Food Insecurity: No Food Insecurity (02/15/2023)   Hunger Vital  Sign    Worried About Running Out of Food in the Last Year: Never true    Ran Out of Food in the Last Year: Never true  Transportation Needs: No Transportation Needs (02/15/2023)   PRAPARE - Administrator, Civil Service (Medical): No    Lack of Transportation (Non-Medical): No  Physical Activity: Insufficiently Active (02/15/2023)   Exercise Vital Sign    Days of Exercise per Week: 3 days    Minutes of Exercise per Session: 20 min  Stress: No Stress Concern Present (02/15/2023)   [employer] of Occupational Health - Occupational Stress Questionnaire    Feeling of Stress : Not at all  Social Connections: Moderately Integrated (02/15/2023)   Social Connection and Isolation Panel [NHANES]  Frequency of Communication with Friends and Family: More than three times a week    Frequency of Social Gatherings with Friends and Family: Three times a week    Attends Religious Services: More than 4 times per year    Active Member of Clubs or Organizations: Yes    Attends Banker Meetings: 1 to 4 times per year    Marital Status: Widowed    Tobacco Counseling Counseling given: Not Answered   Clinical Intake:  Pre-visit preparation completed: Yes  Pain : No/denies pain     BMI - recorded: 32.01 Nutritional Status: BMI > 30  Obese Nutritional Risks: None  How often do you need to have someone help you when you read instructions, pamphlets, or other written materials from your doctor or pharmacy?: 1 - Never  Interpreter Needed?: No  Information entered by :: Lanier Ensign, LPN   Activities of Daily Living    02/15/2023    7:59 PM  In your present state of health, do you have any difficulty performing the following activities:  Hearing? 0  Vision? 0  Difficulty concentrating or making decisions? 0  Walking or climbing stairs? 0  Dressing or bathing? 0  Doing errands, shopping? 0  Preparing Food and eating ? N  Using the Toilet? N  In the past  six months, have you accidently leaked urine? N  Do you have problems with loss of bowel control? N  Managing your Medications? N  Managing your Finances? N  Housekeeping or managing your Housekeeping? N    Patient Care Team: Joaquim Nam, MD as PCP - General (Family Medicine) [name], MD as Consulting Physician (Orthopedic Surgery)  Indicate any recent Medical Services you may have received from other than [facility] providers in the past year (date may be approximate).     Assessment:   This is a routine wellness examination for Raquel.  Hearing/Vision screen Hearing Screening - Comments:: Pt denies any hearing issue  Vision Screening - Comments:: Pt [facility] for annual [facility] exams    Goals Addressed             This Visit's Progress    Patient Stated       Better eating habits and lose weight        Depression Screen    02/16/2023    2:12 PM 10/08/2022    2:28 PM 12/11/2021   10:41 AM 08/17/2019    3:41 PM 03/10/2018   12:50 PM  PHQ 2/9 Scores  PHQ - 2 Score 0 0 0 0 0  PHQ- 9 Score  0  0     Fall Risk    02/15/2023    7:59 PM 10/08/2022    2:28 PM 12/11/2021   10:39 AM 08/17/2019    3:40 PM  Fall Risk   Falls in the past year? 1 1 0 1  Comment    has knee pain  Number falls in past yr: 1 1 1  0  Injury with Fall? 0 0 0 0  Risk for fall due to : History of fall(s) No Fall Risks  Impaired mobility  Follow up Falls prevention discussed Falls evaluation completed Falls evaluation completed;Falls prevention discussed;Education provided Falls evaluation completed;Falls prevention discussed    MEDICARE RISK AT HOME: Medicare Risk at Home Any stairs in or around the home?: Yes If so, are there any without handrails?: No Home free of loose throw rugs in walkways, pet beds, electrical cords,  etc?: Yes Adequate lighting in your home to reduce risk of falls?: Yes Life alert?: No Use of a cane, walker or w/c?: No Grab bars in the bathroom?:  No Shower chair or bench in shower?: No Elevated toilet seat or a handicapped toilet?: No  TIMED UP AND GO:  Was the test performed?  No    Cognitive Function:    08/17/2019    3:43 PM  MMSE - Mini Mental State Exam  Orientation to time 5  Orientation to Place 5  Registration 3  Attention/ Calculation 5  Recall 3  Language- repeat 1        02/16/2023    2:15 PM 12/11/2021   10:37 AM  6CIT Screen  What Year? 0 points 0 points  What month? 0 points   What time? 0 points 0 points  Count back from 20 0 points 0 points  Months in reverse 0 points 0 points  Repeat phrase 0 points 0 points  Total Score 0 points     Immunizations Immunization History  Administered Date(s) Administered   Hepatitis B, ADULT 07/04/1991, 08/01/1991, 01/12/1992   Moderna Sars-Covid-2 Vaccination 11/13/2019, 12/11/2019    TDAP status: Due, Education has been provided regarding the importance of this vaccine. Advised may receive this vaccine at local pharmacy or Health Dept. Aware to provide a copy of the vaccination record if obtained from local pharmacy or Health Dept. Verbalized acceptance and understanding.  Flu Vaccine status: Declined, Education has been provided regarding the importance of this vaccine but patient still declined. Advised may receive this vaccine at local pharmacy or Health Dept. Aware to provide a copy of the vaccination record if obtained from local pharmacy or Health Dept. Verbalized acceptance and understanding.  Pneumococcal vaccine status: Due, Education has been provided regarding the importance of this vaccine. Advised may receive this vaccine at local pharmacy or Health Dept. Aware to provide a copy of the vaccination record if obtained from local pharmacy or Health Dept. Verbalized acceptance and understanding.  Covid-19 vaccine status: Information provided on how to obtain vaccines.   Qualifies for Shingles Vaccine? Yes   Zostavax completed No   Shingrix  Completed?: No.    Education has been provided regarding the importance of this vaccine. Patient has been advised to call insurance company to determine out of pocket expense if they have not yet received this vaccine. Advised may also receive vaccine at local pharmacy or Health Dept. Verbalized acceptance and understanding.  Screening Tests Health Maintenance  Topic Date Due   DTaP/Tdap/Td (1 - Tdap) Never done   Zoster Vaccines- Shingrix (1 of 2) Never done   Pneumonia Vaccine 18+ Years old (1 of 1 - PCV) Never done   INFLUENZA VACCINE  07/26/2023 (Originally 11/26/2022)   Medicare Annual Wellness (AWV)  02/16/2024   MAMMOGRAM  09/13/2024   Colonoscopy  09/02/2031   DEXA SCAN  Completed   Hepatitis C Screening  Completed   HPV VACCINES  Aged Out   COVID-19 Vaccine  Discontinued    Health Maintenance  Health Maintenance Due  Topic Date Due   DTaP/Tdap/Td (1 - Tdap) Never done   Zoster Vaccines- Shingrix (1 of 2) Never done   Pneumonia Vaccine 68+ Years old (1 of 1 - PCV) Never done    Colorectal cancer screening: Type of screening: Colonoscopy. Completed 09/01/21. Repeat every 10 years  Mammogram status: Completed 09/14/22. Repeat every year  Bone Density status: Completed 08/31/22. Results reflect: Bone density results: OSTEOPENIA. Repeat every  2 years.  Additional Screening:  Hepatitis C Screening: Completed 06/06/17  Vision Screening: Recommended annual ophthalmology exams for early detection of glaucoma and other disorders of the eye. Is the patient up to date with their annual eye exam?  Yes  Who is the provider or what is the name of the office in which the patient attends annual eye exams? [facility]  If pt is not established with a provider, would they like to be referred to a provider to establish care? No .   Dental Screening: Recommended annual dental exams for proper oral hygiene    Community Resource Referral / Chronic Care Management: CRR required this visit?  No    CCM required this visit?  No     Plan:     I have personally reviewed and noted the following in the patient's chart:   Medical and social history Use of alcohol, tobacco or illicit drugs  Current medications and supplements including opioid prescriptions. Patient is not currently taking opioid prescriptions. Functional ability and status Nutritional status Physical activity Advanced directives List of other physicians Hospitalizations, surgeries, and ER visits in previous 12 months Vitals Screenings to include cognitive, depression, and falls Referrals and appointments  In addition, I have reviewed and discussed with patient certain preventive protocols, quality metrics, and best practice recommendations. A written personalized care plan for preventive services as well as general preventive health recommendations were provided to patient.     Marzella Schlein, LPN   [id number]   After Visit Summary: (MyChart) Due to this being a telephonic visit, the after visit summary with patients personalized plan was offered to patient via MyChart   Nurse Notes: none

## 2023-02-16 NOTE — Patient Instructions (Signed)
Denise Norris , Thank you for taking time to come for your Medicare Wellness Visit. I appreciate your ongoing commitment to your health goals. Please review the following plan we discussed and let me know if I can assist you in the future.   Referrals/Orders/Follow-Ups/Clinician Recommendations: Aim for 30 minutes of exercise or brisk walking, 6-8 glasses of water, and 5 servings of fruits and vegetables each day.   Each day, aim for 6 glasses of water, plenty of protein in your diet and try to get up and walk/ stretch every hour for 5-10 minutes at a time.  Continue to work on better eating habits and exercise more   This is a list of the screening recommended for you and due dates:  Health Maintenance  Topic Date Due   DTaP/Tdap/Td vaccine (1 - Tdap) Never done   Zoster (Shingles) Vaccine (1 of 2) Never done   Pneumonia Vaccine (1 of 1 - PCV) Never done   Flu Shot  Never done   Medicare Annual Wellness Visit  02/16/2024   Mammogram  09/13/2024   Colon Cancer Screening  09/02/2031   DEXA scan (bone density measurement)  Completed   Hepatitis C Screening  Completed   HPV Vaccine  Aged Out   COVID-19 Vaccine  Discontinued    Advanced directives: (Copy Requested) Please bring a copy of your health care power of attorney and living will to the office to be added to your chart at your convenience.  Next Medicare Annual Wellness Visit scheduled for next year: Yes

## 2023-02-26 IMAGING — DX DG ABDOMEN 2V
2 series · 2 of 2 positions shown · non-contrast
Comparison: None.

CLINICAL DATA: Constipation

EXAM:
ABDOMEN - 2 VIEW

[abdomen standing ap]
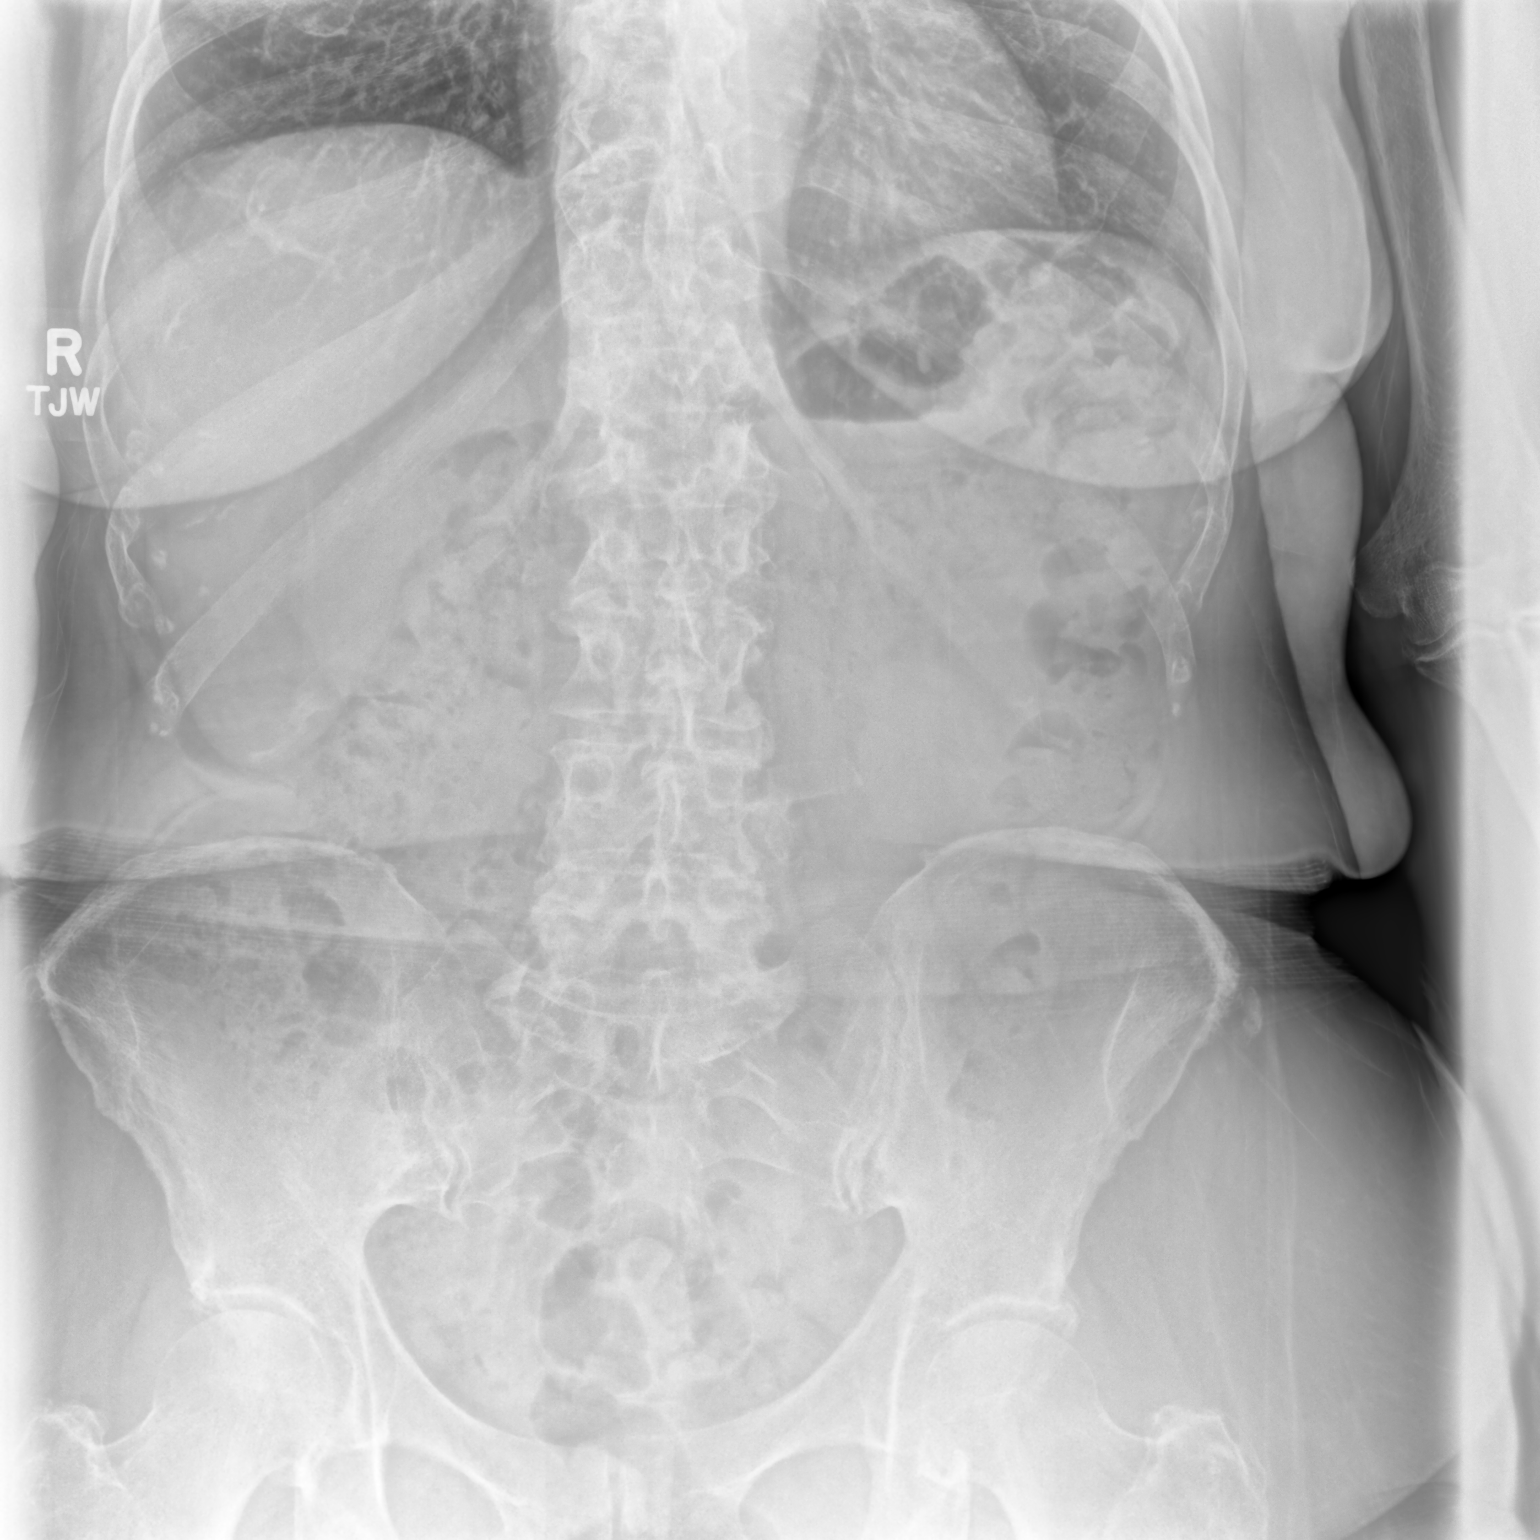

[abdomen supine ap]
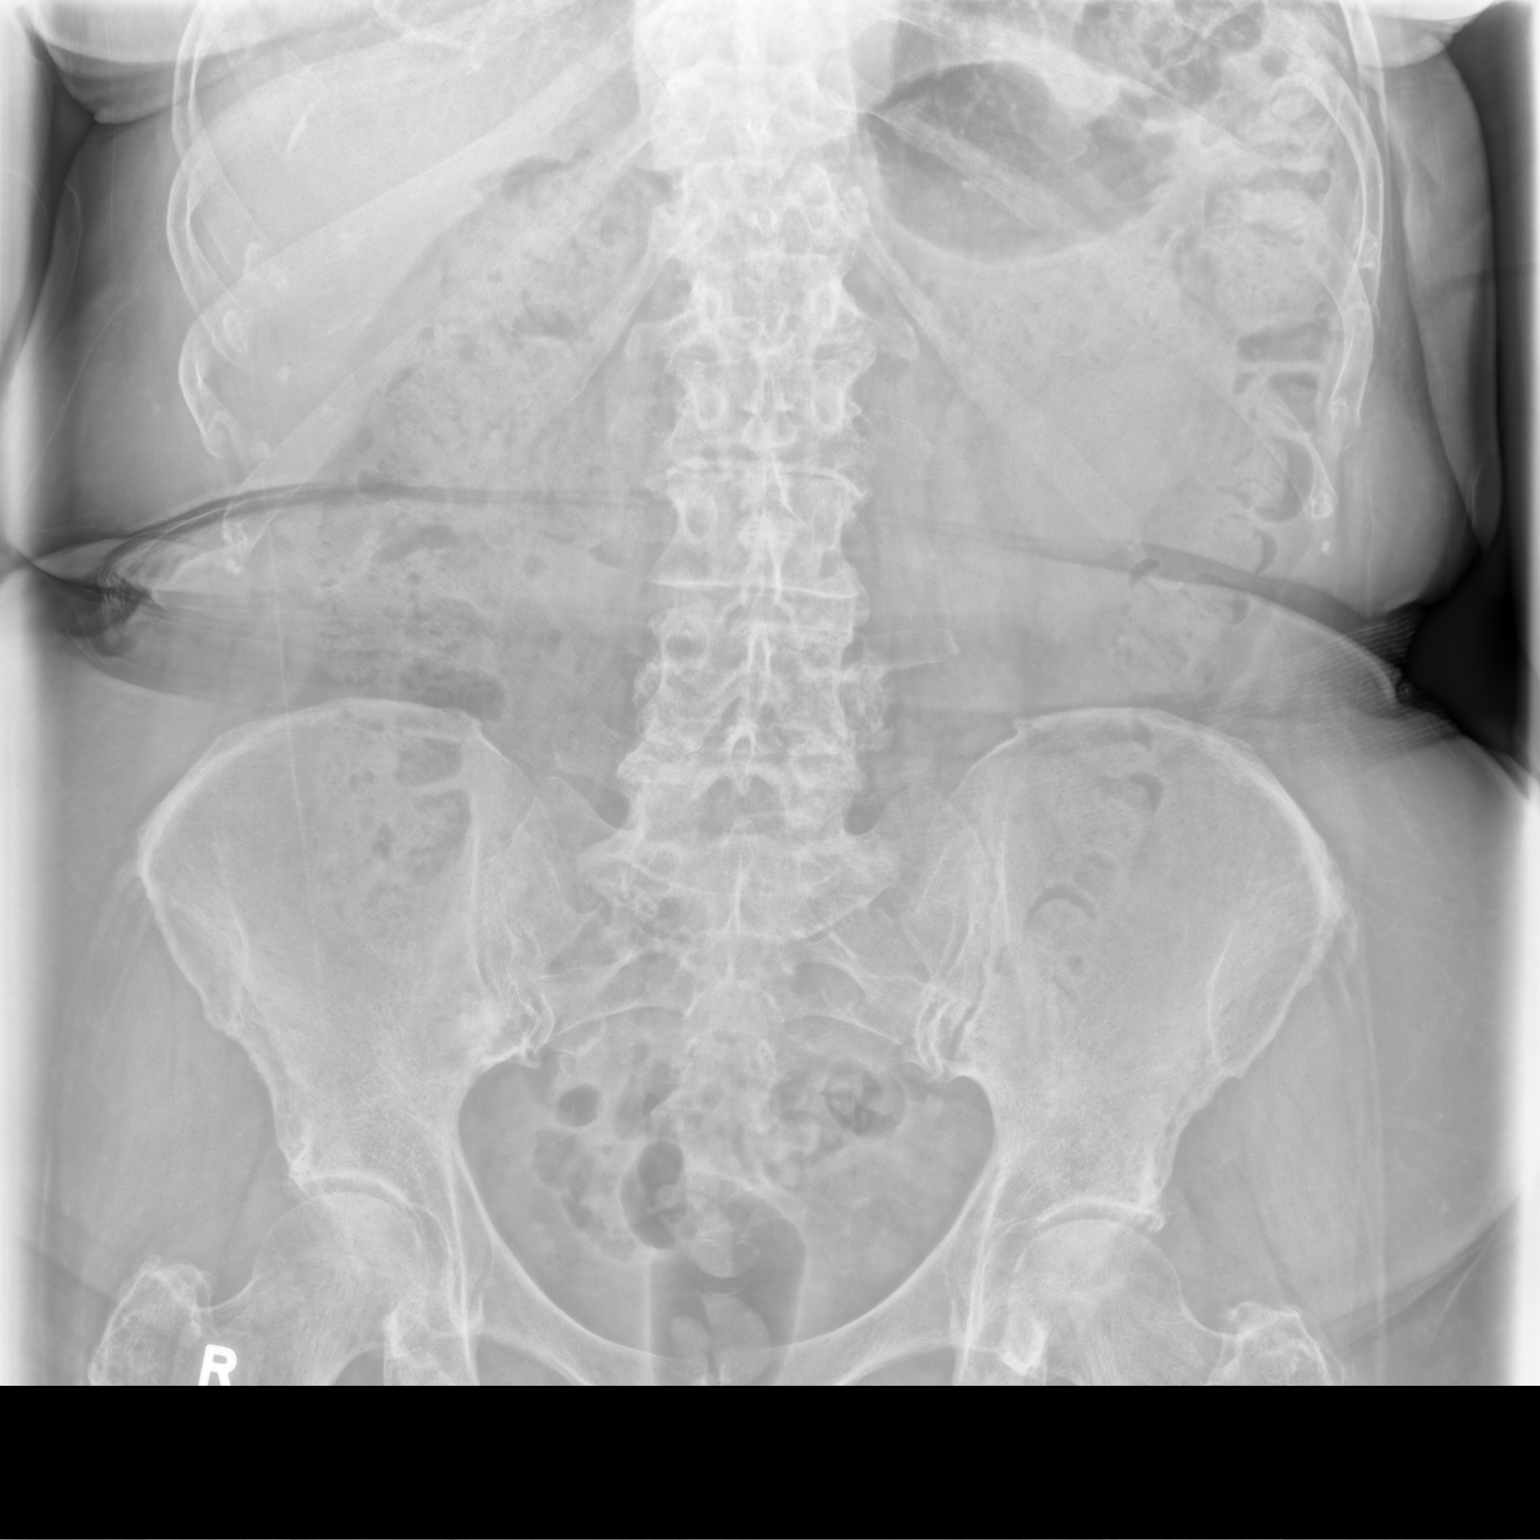

[2 of 2 positions shown; findings below may reference images not displayed]

FINDINGS: No dilated loops of large or small bowel. Moderate volume stool in
the RIGHT colon. Gas and stool in the rectum. No pathologic
calcifications. No organomegaly. No acute osseous abnormality
IMPRESSION: Moderate volume stool in the RIGHT colon.  No obstruction.

## 2023-03-22 DIAGNOSIS — H401131 Primary open-angle glaucoma, bilateral, mild stage: Secondary | ICD-10-CM | POA: Diagnosis not present

## 2023-03-22 DIAGNOSIS — H00025 Hordeolum internum left lower eyelid: Secondary | ICD-10-CM | POA: Diagnosis not present

## 2023-05-04 DIAGNOSIS — R350 Frequency of micturition: Secondary | ICD-10-CM | POA: Diagnosis not present

## 2023-05-17 ENCOUNTER — Other Ambulatory Visit (HOSPITAL_COMMUNITY)
Admission: RE | Admit: 2023-05-17 | Discharge: 2023-05-17 | Disposition: A | Discharge status: Home / Self Care | Payer: Medicare Other | Source: Ambulatory Visit | Attending: Obstetrics | Admitting: Obstetrics

## 2023-05-17 ENCOUNTER — Encounter: Payer: Self-pay | Admitting: Obstetrics and Gynecology

## 2023-05-17 ENCOUNTER — Ambulatory Visit (INDEPENDENT_AMBULATORY_CARE_PROVIDER_SITE_OTHER): Payer: Medicare Other | Admitting: Obstetrics and Gynecology

## 2023-05-17 VITALS — BP 173/82 | HR 66 | Ht 60.5 in | Wt 178.2 lb

## 2023-05-17 DIAGNOSIS — R319 Hematuria, unspecified: Secondary | ICD-10-CM

## 2023-05-17 DIAGNOSIS — M6289 Other specified disorders of muscle: Secondary | ICD-10-CM

## 2023-05-17 DIAGNOSIS — Z87440 Personal history of urinary (tract) infections: Secondary | ICD-10-CM | POA: Diagnosis not present

## 2023-05-17 DIAGNOSIS — N812 Incomplete uterovaginal prolapse: Secondary | ICD-10-CM | POA: Diagnosis not present

## 2023-05-17 DIAGNOSIS — R350 Frequency of micturition: Secondary | ICD-10-CM | POA: Diagnosis not present

## 2023-05-17 DIAGNOSIS — N362 Urethral caruncle: Secondary | ICD-10-CM | POA: Diagnosis not present

## 2023-05-17 DIAGNOSIS — N952 Postmenopausal atrophic vaginitis: Secondary | ICD-10-CM | POA: Diagnosis not present

## 2023-05-17 DIAGNOSIS — N390 Urinary tract infection, site not specified: Secondary | ICD-10-CM

## 2023-05-17 LAB — URINALYSIS, ROUTINE W REFLEX MICROSCOPIC
Bilirubin Urine: NEGATIVE
Glucose, UA: NEGATIVE mg/dL
Hgb urine dipstick: NEGATIVE
Ketones, ur: NEGATIVE mg/dL
Leukocytes,Ua: NEGATIVE
Nitrite: NEGATIVE
Protein, ur: NEGATIVE mg/dL
Specific Gravity, Urine: 1.019 (ref 1.005–1.030)
pH: 6 (ref 5.0–8.0)

## 2023-05-17 LAB — POCT URINALYSIS DIPSTICK
Bilirubin, UA: NEGATIVE
Glucose, UA: NEGATIVE
Ketones, UA: NEGATIVE
Leukocytes, UA: NEGATIVE
Nitrite, UA: NEGATIVE
Protein, UA: NEGATIVE
Spec Grav, UA: >=1.03 — AB (ref 1.010–1.025)
Urobilinogen, UA: 0.2 U/dL
pH, UA: 7 (ref 5.0–8.0)

## 2023-05-17 MED ORDER — VIBEGRON 75 MG PO TABS
75.0000 mg | ORAL_TABLET | Freq: Every day | ORAL | 5 refills | Status: DC
Start: 2023-05-17 — End: 2023-06-02

## 2023-05-17 MED ORDER — ESTRADIOL 0.1 MG/GM VA CREA: 0.5000 g | TOPICAL_CREAM | VAGINAL | 11 refills | Status: AC

## 2023-05-17 NOTE — Patient Instructions (Addendum)
Start estrogen nightly for the first two weeks and then after that use it twice a week.   Try to drink 60-80oz of fluid daily.   You may want to try to stop drinking your fluids about 1.5-2 hours prior to bed  We will start Gemtesa for the overactive bladder. The goal would be for you to be getting up less than 2 times per night and have less leakage trying to make it to the bathroom.   Continue D-mannose and Cranberry extract.   If you wanted to become sexually active please let me know and we can discuss that.   For your UTI's if you feel you are getting another UTI and are in [place] please let me know and we can get you in for a sample and coordinate care for prophylaxis.

## 2023-05-17 NOTE — Progress Notes (Signed)
[facility] New Patient Evaluation and Consultation  Referring Provider: Darcus Mclaurin, MD PCP: Joaquim Nam, MD Date of Service: 05/17/2023  SUBJECTIVE Chief Complaint: New Patient (Initial Visit) Denise Norris is a 73 y.o. female is here frequent UTI.)  History of Present Illness: Denise Norris is a 73 y.o. White or Caucasian female seen in consultation at the request of Dr. Carney Corners for evaluation of rUTI.  Patient lost a grandchild a few years ago related to an E. coli infection that makes her very anxious when she is told she has E. coli in her urine.  The same year that she lost her grandchild she also reports that the loss of her husband which was very difficult as well.  Patient endorses some mild urgency and loss of urine trying to make it to the bathroom.  Review of records significant for:  Urine Cultures 05/04/23: >100,000 +E. Coli, Resistant to ampicillin  02/16/23:>100,000 +E. Coli, Resistant to ampicillin  01/11/23:>100,000 +E. Coli, Resistant to ampicillin  11/12/22: >100,000 +E. Coli, Resistant to ampicillin, Tetracycline   +Blood in Urinalysis   +Open angle Glaucoma   Bathes in dial soap, wears pads  Urinary Symptoms: Does not leak urine.    Day time voids 7-8.  Nocturia: 4-5 times per night to void. Voiding dysfunction:  empties bladder well.  Patient does not use a catheter to empty bladder.  When urinating, patient feels a weak stream Drinks: 60-120oz Water per day  UTIs: 5 UTI's in the last year.   Denies history of blood in urine, kidney or bladder stones, pyelonephritis, bladder cancer, and kidney cancer    Pelvic Organ Prolapse Symptoms:                  Patient Denies a feeling of a bulge the vaginal area.  Patient Denies seeing a bulge.   Bowel Symptom: Bowel movements: 1 time(s) per day Stool consistency: solid Straining: no.  Splinting: no.  Incomplete evacuation: no.  Patient Denies accidental bowel leakage / fecal  incontinence Bowel regimen: stool softener Last colonoscopy: Date 2023, Results One small polyp HM Colonoscopy          Upcoming     Colonoscopy (Every 10 Years) Next due on 09/02/2031    09/01/2021  COLONOSCOPY   Only the first 1 history entries have been loaded, but more history exists.                Sexual Function Sexually active: no.  Sexual orientation: Straight Pain with sex: No  Pelvic Pain Denies pelvic pain    Past Medical History:  Past Medical History:  Diagnosis Date   Arthritis    Connective tissue disorder (HCC)    Unspecified, previously followed by [facility].   GERD (gastroesophageal reflux disease)    Glaucoma    Migraine      Past Surgical History:   Past Surgical History:  Procedure Laterality Date   BACK SURGERY     CARPAL TUNNEL RELEASE Bilateral    CATARACT EXTRACTION Bilateral    CERVICAL SPINE SURGERY     C5/C6 repair.  Previous right arm radiculopathy resolved with surgery.   COLONOSCOPY N/A 09/01/2021   Procedure: COLONOSCOPY;  Surgeon: Midge Minium, MD;  Location: [facility];  Service: Endoscopy;  Laterality: N/A;   JOINT REPLACEMENT     POLYPECTOMY  09/01/2021   Procedure: POLYPECTOMY;  Surgeon: Midge Minium, MD;  Location: [facility];  Service: Endoscopy;;   TOTAL KNEE  ARTHROPLASTY Right 02/01/2019   Procedure: TOTAL KNEE ARTHROPLASTY;  Surgeon: [name], MD;  Location: [facility];  Service: Orthopedics;  Laterality: Right;   TOTAL KNEE ARTHROPLASTY Left 10/11/2019   Procedure: TOTAL KNEE ARTHROPLASTY;  Surgeon: [name], MD;  Location: [facility];  Service: Orthopedics;  Laterality: Left;     Past OB/GYN History: G1 P1 G1P1001 Vaginal deliveries: 1,  Forceps/ Vacuum deliveries: 1, Cesarean section: 0 Menopausal: Yes, at age mid-fifties Contraception: Tubal ligation. Last pap smear was 06/23/21.  Any history of abnormal pap smears: no. HM PAP   This patient has no relevant Health Maintenance  data.     Medications: Patient has a current medication list which includes the following prescription(s): acetaminophen, amlodipine, calcium carbonate-vit d-min, docusate sodium, estradiol, famotidine, glucosamine-chondroitin-msm, multiple vitamin, multiple vitamins-minerals, timolol, vibegron, vitamin d (cholecalciferol), and vitamin k.   Allergies: Patient is allergic to codeine.   Social History:  Social History   Tobacco Use   Smoking status: Never   Smokeless tobacco: Never  Vaping Use   Vaping status: Never Used  Substance Use Topics   Alcohol use: Never   Drug use: Never    Relationship status: widowed Patient lives alone.   Patient is not employed. Regular exercise: No History of abuse: No  Family History:   Family History  Problem Relation Age of Onset   Cancer Mother        lung cancer   Breast cancer Sister    Colon cancer Paternal Grandfather        possible     Review of Systems: Review of Systems  Constitutional:  Negative for chills and fever.  Respiratory:  Negative for cough and shortness of breath.   Cardiovascular:  Negative for chest pain and palpitations.  Gastrointestinal:  Negative for abdominal pain, blood in stool, constipation and diarrhea.  Skin:  Negative for rash.  Neurological:  Positive for dizziness. Negative for weakness.  Endo/Heme/Allergies:  Bruises/bleeds easily.  Psychiatric/Behavioral:  Negative for depression and suicidal ideas.      OBJECTIVE Physical Exam: Vitals:   05/17/23 0909 05/17/23 1015  BP: (!) 170/83 (!) 173/82  Pulse: 63 66  Weight: 178 lb 3.2 oz (80.8 kg)   Height: 5' 0.5" (1.537 m)     Physical Exam Vitals reviewed. Exam conducted with a chaperone present.  Constitutional:      Appearance: Normal appearance.  Pulmonary:     Effort: Pulmonary effort is normal.  Abdominal:     Palpations: Abdomen is soft.  Skin:    General: Skin is warm and dry.  Neurological:     General: No focal deficit  present.     Mental Status: She is alert and oriented to person, place, and time.  Psychiatric:        Mood and Affect: Mood normal.        Behavior: Behavior normal. Behavior is cooperative.        Thought Content: Thought content normal.      GU / Detailed Urogynecologic Evaluation:  Pelvic Exam: Normal external female genitalia; Bartholin's and Skene's glands normal in appearance; urethral meatus normal in appearance, no urethral masses or discharge.   CST: negative   Speculum exam reveals normal vaginal mucosa with atrophy. Cervix normal appearance. Uterus normal single, nontender. Adnexa normal adnexa.    With apex supported, anterior compartment defect was present  Pelvic floor strength II/V  Pelvic floor musculature: Right levator tender, Right obturator tender, Left levator non-tender, Left obturator tender  POP-Q:   POP-Q  -2                                            Aa   -2                                           Ba  -3                                              C   -3                                            Gh  4                                            Pb  8                                            tvl   -2.5                                            Ap  -2.5                                            Bp  -4.4                                              D      Rectal Exam:  Normal external exam  Post-Void Residual (PVR) by Bladder Scan: In order to evaluate bladder emptying, we discussed obtaining a postvoid residual and patient agreed to this procedure.  Procedure: The ultrasound unit was placed on the patient's abdomen in the suprapubic region after the patient had voided.      Laboratory Results: Lab Results  Component Value Date   COLORU Yellow 05/17/2023   CLARITYU Clear 05/17/2023   GLUCOSEUR Negative 05/17/2023   BILIRUBINUR NEGATIVE 05/17/2023   KETONESU Negative 05/17/2023   SPECGRAV >=1.030 (A) 05/17/2023    RBCUR Trace-Intact 05/17/2023   PHUR 7.0 05/17/2023   PROTEINUR NEGATIVE 05/17/2023   UROBILINOGEN 0.2 05/17/2023   LEUKOCYTESUR NEGATIVE 05/17/2023    Lab Results  Component Value Date   CREATININE 0.62 10/08/2022   CREATININE 0.64 09/04/2021   CREATININE 0.63 10/12/2019    No results found for: "HGBA1C"  Lab Results  Component Value Date   HGB 11.3 (L) 10/08/2022     ASSESSMENT  AND PLAN Ms. Galano is a 73 y.o. with:  1. Recurrent UTI   2. Vaginal atrophy   3. Urethral caruncle   4. Urinary frequency   5. Hematuria, unspecified type   6. Pelvic floor dysfunction   7. Uterovaginal prolapse, incomplete     For patient's recurrent UTI we discussed today doing vaginal estrogen cream as the first line of defense.  We discussed that her UTIs that have been positive for E. coli are often time caused by lack of estrogen supporting the urethra and bacteria that is normal flora in the colon makes its way to the urethra and causes the infection.  I did my best to assure patient that E. coli is and not something that we can 100% get rid of as it is normal in the rectum.  We discussed not using soap in the vagina area and that if she wanted to use something as a cleanser that Cetaphil is recommended by infectious disease.  Patient is already taking cranberry extract and d-mannose and we discussed that she should continue this.  If she continues to get more urinary tract infections it would be a good idea to do prophylaxis including either Hiprex or a daily low-dose antibiotic. Patient has vaginal atrophy on exam. She would benefit from estrogen cream. Patient to use a blueberry sized amount into the vagina. She may use this nightly for 2 weeks and then twice weekly after. We discussed using her finger instead of using the applicator.  Using a mirror today I showed patient where her urethral carbuncle is and what that looks like and that the estrogen will help to treat this and reduce  irritation around the urethra.  She reported understanding of where to put the estrogen cream and seem to be empowered with the knowledge that this was a condition that can be supported with medication. We discussed the symptoms of overactive bladder (OAB), which include urinary urgency, urinary frequency, nocturia, with or without urge incontinence.  While we do not know the exact etiology of OAB, several treatment options exist. We discussed management including behavioral therapy (decreasing bladder irritants, urge suppression strategies, timed voids, bladder retraining), physical therapy, medication. For anticholinergic medications, we discussed the potential side effects of anticholinergics including dry eyes, dry mouth, constipation, cognitive impairment and urinary retention.For Beta-3 agonist medication, we discussed the potential side effect of elevated blood pressure which is more likely to occur in individuals with uncontrolled hypertension.For patient's urinary frequency and nocturia have started her on Gemtesa.  Patient would not be a good candidate for anticholinergic medication as she already struggles with constipation issues and that she is over the age of 73.  Patient would also not be a good candidate for Myrbetriq due to her elevated blood pressure today in the office and previous high blood pressure readings.  Today's blood pressure initially was 173/82 as noted in the above vitals section.  Would not want to risk patient's blood pressures being more elevated and patient has had open angle glaucoma. Patient has had blood historically in her urine, will send for urine microscopic today.  It could be related to her caruncle.  Patient had high tone pelvic floor dysfunction today on exam with significant tenderness to the bilateral obturator muscles.  Patient reports she does not feel significant pain in the pelvic floor on a day-to-day basis and reports she is not sexually active.  We discussed  that if she were to desire to become sexually active and it would be  good to make a plan.  She reports that she does not plan to be sexually active but if she decides that she would like to be she will let me know and we can discuss further planning details. Patient has stage I anterior stage I uterine and stage I posterior vaginal prolapse.  I briefly discussed this with patient and told her that if she is not having any significant side effects then nothing needs to be done at this time.  We discussed what side effects would look like including a possibility of pressure in the vagina feeling like she is sitting on a bulge or having trouble urinating.  She denies any of the symptoms.  Will expectantly manage at this time.  Patient to follow-up in 3 months or sooner if needed.   Selmer Dominion, NP

## 2023-05-18 ENCOUNTER — Encounter: Payer: Self-pay | Admitting: Obstetrics

## 2023-05-19 NOTE — Progress Notes (Unsigned)
[employer] Approved patient's Gemtesa medication  Approval good until 04/26/2024

## 2023-06-01 ENCOUNTER — Telehealth: Payer: Self-pay

## 2023-06-01 NOTE — Telephone Encounter (Signed)
Patient called stating although Leslye Peer was approved, it is still expensive. She's requesting a cheaper medication.

## 2023-06-02 ENCOUNTER — Other Ambulatory Visit: Payer: Self-pay | Admitting: Obstetrics and Gynecology

## 2023-06-02 MED ORDER — TROSPIUM CHLORIDE ER 60 MG PO CP24: 1.0000 | ORAL_CAPSULE | Freq: Every day | ORAL | 5 refills | Status: DC

## 2023-06-02 NOTE — Telephone Encounter (Signed)
 I have prescribed Trospium  60mg  ER daily. Would not recommend Myrbetriq due to elevated blood pressure readings while patient was in office. Also would not suggest other anticholinergics as they cross the blood brain barrier and are more likely to cause dry mouth, dry eyes, constipation and brain fog. Unsure if this medication will need a Tier exception/PA.

## 2023-06-03 ENCOUNTER — Other Ambulatory Visit: Payer: Self-pay

## 2023-06-03 MED ORDER — TROSPIUM CHLORIDE ER 60 MG PO CP24: 1.0000 | ORAL_CAPSULE | Freq: Every day | ORAL | 5 refills | Status: DC

## 2023-06-03 NOTE — Telephone Encounter (Signed)
 Patient's has been approved for Trospium  60 mg. Medication has been sent to [facility] in [place].

## 2023-06-13 ENCOUNTER — Other Ambulatory Visit: Payer: Self-pay | Admitting: Family Medicine

## 2023-06-27 ENCOUNTER — Other Ambulatory Visit: Payer: Self-pay | Admitting: Family Medicine

## 2023-06-28 ENCOUNTER — Other Ambulatory Visit: Payer: Self-pay

## 2023-06-28 ENCOUNTER — Other Ambulatory Visit: Payer: Self-pay | Admitting: Obstetrics

## 2023-06-28 ENCOUNTER — Telehealth: Payer: Self-pay

## 2023-06-28 DIAGNOSIS — N390 Urinary tract infection, site not specified: Secondary | ICD-10-CM

## 2023-06-28 NOTE — Progress Notes (Signed)
 Orders faxed to lab requested.  Please call if you continue to experience persistent or worsening urinary symptoms such as fever > 100.4, nausea/vomiting, one sided back pain or blood in your urine.  We can send a prescription after she drops off urine sample.

## 2023-06-28 NOTE — Telephone Encounter (Signed)
 Patient called the office requesting a UA order sent to [facility] [address] Ervin Knack, [place], [place] [address]. Patient lives in [place] which is an hour away. Patient also stated she's spoken to [facility] about going to this facility and Kaitlin ok'd it.

## 2023-06-28 NOTE — Telephone Encounter (Signed)
 Denise Norris is a 73 y.o. female called because she was told to go to lab care in [place] to leave a urine sample. Pt said she needs to have an order sent to the lab so they can run a culture.  [facility] phone number [phone] Fax #: [phone] Pt said it to send it ATTN to Alana

## 2023-06-29 ENCOUNTER — Other Ambulatory Visit: Payer: Self-pay | Admitting: Obstetrics

## 2023-06-29 ENCOUNTER — Other Ambulatory Visit: Payer: Self-pay | Admitting: Obstetrics and Gynecology

## 2023-06-29 MED ORDER — SULFAMETHOXAZOLE-TRIMETHOPRIM 800-160 MG PO TABS: 1.0000 | ORAL_TABLET | Freq: Two times a day (BID) | ORAL | 0 refills | Status: AC

## 2023-06-29 MED ORDER — SULFAMETHOXAZOLE-TRIMETHOPRIM 800-160 MG PO TABS: 1.0000 | ORAL_TABLET | Freq: Two times a day (BID) | ORAL | 0 refills | Status: DC

## 2023-06-29 NOTE — Addendum Note (Signed)
 Addended by: Selmer Dominion on: 06/29/2023 03:31 PM   Modules accepted: Orders

## 2023-06-29 NOTE — Progress Notes (Signed)
 Urine with large leukocytes and 3+ bactrim. Will start Bactrim while culture results are pending.

## 2023-08-16 ENCOUNTER — Other Ambulatory Visit (HOSPITAL_COMMUNITY)
Admission: RE | Admit: 2023-08-16 | Discharge: 2023-08-16 | Disposition: A | Discharge status: Home / Self Care | Source: Other Acute Inpatient Hospital | Attending: Obstetrics and Gynecology | Admitting: Obstetrics and Gynecology

## 2023-08-16 ENCOUNTER — Ambulatory Visit (INDEPENDENT_AMBULATORY_CARE_PROVIDER_SITE_OTHER): Payer: Medicare Other | Admitting: Obstetrics and Gynecology

## 2023-08-16 ENCOUNTER — Encounter: Payer: Self-pay | Admitting: Obstetrics and Gynecology

## 2023-08-16 VITALS — BP 150/83 | HR 67

## 2023-08-16 DIAGNOSIS — R319 Hematuria, unspecified: Secondary | ICD-10-CM

## 2023-08-16 DIAGNOSIS — R82998 Other abnormal findings in urine: Secondary | ICD-10-CM | POA: Insufficient documentation

## 2023-08-16 DIAGNOSIS — Z87440 Personal history of urinary (tract) infections: Secondary | ICD-10-CM | POA: Diagnosis not present

## 2023-08-16 DIAGNOSIS — R350 Frequency of micturition: Secondary | ICD-10-CM | POA: Diagnosis not present

## 2023-08-16 DIAGNOSIS — N390 Urinary tract infection, site not specified: Secondary | ICD-10-CM

## 2023-08-16 DIAGNOSIS — N362 Urethral caruncle: Secondary | ICD-10-CM | POA: Diagnosis not present

## 2023-08-16 DIAGNOSIS — N952 Postmenopausal atrophic vaginitis: Secondary | ICD-10-CM

## 2023-08-16 LAB — URINALYSIS, ROUTINE W REFLEX MICROSCOPIC
Bilirubin Urine: NEGATIVE
Glucose, UA: NEGATIVE mg/dL
Hgb urine dipstick: NEGATIVE
Ketones, ur: NEGATIVE mg/dL
Nitrite: NEGATIVE
Protein, ur: NEGATIVE mg/dL
Specific Gravity, Urine: 1.021 (ref 1.005–1.030)
WBC, UA: >50 WBC/hpf (ref 0–5)
pH: 6 (ref 5.0–8.0)

## 2023-08-16 LAB — POCT URINALYSIS DIPSTICK
Bilirubin, UA: NEGATIVE
Glucose, UA: NEGATIVE
Ketones, UA: NEGATIVE
Nitrite, UA: POSITIVE
Protein, UA: NEGATIVE
Spec Grav, UA: 1.02 (ref 1.010–1.025)
Urobilinogen, UA: 0.2 U/dL
pH, UA: 6.5 (ref 5.0–8.0)

## 2023-08-16 MED ORDER — SULFAMETHOXAZOLE-TRIMETHOPRIM 800-160 MG PO TABS: 1.0000 | ORAL_TABLET | Freq: Two times a day (BID) | ORAL | 0 refills | Status: AC

## 2023-08-16 MED ORDER — SULFAMETHOXAZOLE-TRIMETHOPRIM 400-80 MG PO TABS: 0.5000 | ORAL_TABLET | Freq: Every day | ORAL | 0 refills | Status: DC

## 2023-08-16 NOTE — Progress Notes (Signed)
 [facility] Return Visit  SUBJECTIVE  History of Present Illness: Denise Norris is a 73 y.o. female seen in follow-up for OAB, urethral caruncle and rUTI. Plan at last visit was start Gemtesa  75mg  daily for OAB symptoms. She reports the medication was not affordable (400$ per month) and an alternative of Trospium  60mg  daily was prescribed.   Patient did have 1 UTI episode in the last 3 months related to E.Coli and she reports she has symptoms today.   Patient has been doing the estrogen cream as prescribed.   Past Medical History: Patient  has a past medical history of Arthritis, Connective tissue disorder (HCC), GERD (gastroesophageal reflux disease), Glaucoma, and Migraine.   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   Medications: She has a current medication list which includes the following prescription(s): acetaminophen , amlodipine , calcium carbonate-vit d-min, docusate sodium , estradiol , famotidine , glucosamine-chondroitin-msm, multiple vitamin, multiple vitamins-minerals, timolol , trospium  chloride, vitamin d  (cholecalciferol ), and vitamin k.   Allergies: Patient is allergic to codeine.   Social History: Patient  reports that she has never smoked. She has never used smokeless tobacco. She reports that she does not drink alcohol and does not use drugs.     OBJECTIVE    Lab Results  Component Value Date   COLORU yellow 08/16/2023   CLARITYU cloudy 08/16/2023   GLUCOSEUR Negative 08/16/2023   BILIRUBINUR negative 08/16/2023   KETONESU negative 08/16/2023   SPECGRAV 1.020 08/16/2023   RBCUR Trace intact 08/16/2023   PHUR 6.5 08/16/2023   PROTEINUR Negative 08/16/2023    UROBILINOGEN 0.2 08/16/2023   LEUKOCYTESUR Moderate (2+) (A) 08/16/2023     Physical Exam: Vitals:   08/16/23 1052 08/16/23 1138  BP: (!) 172/74 (!) 150/83  Pulse: (!) 56 67   Gen: No apparent distress, A&O x 3.  Detailed Urogynecologic Evaluation:  Vaginal tissues have improved from last exam. Irritation has decreased and the urethral caruncle seems smaller. No masses, bleeding, or other concerning vaginal signs.    ASSESSMENT AND PLAN    [name] is a 73 y.o. with:  1. Recurrent UTI   2. Leukocytes in urine   3. Urethral caruncle   4. Hematuria, unspecified type   5. Vaginal atrophy   6. Urinary frequency    Patient has had some improved symptoms from her recurrent UTI. Using just the estrogen cream she has had x1 UTI in the past 3 months but feels she has symptoms again today.  Patient has positive nitrites, leukocytes and trace blood. Will send for culture and treat as a UTI and start patient on prophylaxis. Bactrim  for treatment based on previous cultures as well as for prophylaxis sent to pharmacy.  Patient's urethral caruncle is improving with vaginal estrogen cream. She is to continue on this twice weekly.  Will send urine for micro.  Will attempt a PA or Tier exception for Trospium  60mg  ER daily. Patient is over the age of 85 so medications such as Oxybutynin, Tolterodine, Vesicare, and Detrol are not recommended for patient. She did have some improvement in her frequency and her nocturia prior to having to stop the Trospium  60mg  ER daily. Patient cannot afford Gemtesa  so Trospium  60mg  ER daily is one of the next logical choices.   Patient to follow up in 3 months or sooner  if needed.    Marlita Keil G Ulah Olmo, NP

## 2023-08-16 NOTE — Patient Instructions (Addendum)
 We will contact the insurance about your trospium  and try to get the cost reduced, we will call you about that.   We will sent your urine for culture to rule out infection and call you with the culture results. Please call us  if you need another order sent to rule out UTI. That worked well last time.   Continue to do the estrogen at least twice a week and then pull some of the cream towards the opening.   I want you to try to work some more on your sleeping. We discussed doing the technique called "Noting" today. Remember those thoughts and feeling can be acknowledged and labeled as "thought" or worry, but we can let it go and picture something peaceful. You want to make sure to relax your body and focus on your breathing and relaxing.   There is a great guided meditation by [employer] on youtube that can help you do this as well during the day.

## 2023-08-18 ENCOUNTER — Encounter: Payer: Self-pay | Admitting: Obstetrics and Gynecology

## 2023-08-18 LAB — URINE CULTURE: Culture: >=100000 — AB

## 2023-09-06 ENCOUNTER — Other Ambulatory Visit: Payer: Self-pay | Admitting: Family Medicine

## 2023-10-04 DIAGNOSIS — H401131 Primary open-angle glaucoma, bilateral, mild stage: Secondary | ICD-10-CM | POA: Diagnosis not present

## 2023-10-04 DIAGNOSIS — H353131 Nonexudative age-related macular degeneration, bilateral, early dry stage: Secondary | ICD-10-CM | POA: Diagnosis not present

## 2023-10-04 DIAGNOSIS — H35362 Drusen (degenerative) of macula, left eye: Secondary | ICD-10-CM | POA: Diagnosis not present

## 2023-11-15 ENCOUNTER — Ambulatory Visit: Admitting: Obstetrics and Gynecology

## 2023-11-23 ENCOUNTER — Encounter: Payer: Self-pay | Admitting: Obstetrics and Gynecology

## 2023-11-23 ENCOUNTER — Ambulatory Visit (INDEPENDENT_AMBULATORY_CARE_PROVIDER_SITE_OTHER): Admitting: Obstetrics and Gynecology

## 2023-11-23 VITALS — BP 159/83 | HR 62

## 2023-11-23 DIAGNOSIS — Z87440 Personal history of urinary (tract) infections: Secondary | ICD-10-CM | POA: Diagnosis not present

## 2023-11-23 DIAGNOSIS — N3281 Overactive bladder: Secondary | ICD-10-CM | POA: Diagnosis not present

## 2023-11-23 DIAGNOSIS — N390 Urinary tract infection, site not specified: Secondary | ICD-10-CM

## 2023-11-23 MED ORDER — TROSPIUM CHLORIDE 20 MG PO TABS: 20.0000 mg | ORAL_TABLET | Freq: Two times a day (BID) | ORAL | 5 refills | Status: DC

## 2023-11-23 MED ORDER — SULFAMETHOXAZOLE-TRIMETHOPRIM 400-80 MG PO TABS: 0.5000 | ORAL_TABLET | Freq: Every day | ORAL | 0 refills | Status: DC

## 2023-11-23 NOTE — Patient Instructions (Signed)
 Continue the estrogen cream x2 weekly, we will plan for a vaginal exam at next visit.   For the OAB medication we have changed this to Trospium  20mg . Start with taking it once a day and if you notice it wearing off around dinner time take a second dose at dinner time.   Continue prophylaxis for another 3 months. Call with any UTI symptoms.

## 2023-11-23 NOTE — Progress Notes (Signed)
 [facility] Return Visit  SUBJECTIVE  History of Present Illness: Denise Norris is a 73 y.o. female seen in follow-up for rUTI. Plan at last visit was start Prophylaxis for UTI. Patient was started on low dose bactrim . She has gone 3 months without a UTI and is happy with this at this point.  She is still taking oral cranberry extract. She is doing her vaginal estrogen cream.     Past Medical History: Patient  has a past medical history of Arthritis, Connective tissue disorder (HCC), GERD (gastroesophageal reflux disease), Glaucoma, and Migraine.   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   Medications: She has a current medication list which includes the following prescription(s): acetaminophen , amlodipine , calcium carbonate-vit d-min, docusate sodium , estradiol , famotidine , glucosamine-chondroitin-msm, multiple vitamin, multiple vitamins-minerals, timolol , trospium , vitamin d  (cholecalciferol ), vitamin k, and sulfamethoxazole -trimethoprim .   Allergies: Patient is allergic to codeine.   Social History: Patient  reports that she has never smoked. She has never used smokeless tobacco. She reports that she does not drink alcohol and does not use drugs.     OBJECTIVE     Physical Exam: Vitals:   11/23/23 1101  BP: (!) 159/83  Pulse: 62   Gen: No apparent distress, A&O x 3.  Detailed Urogynecologic Evaluation:  Deferred.    ASSESSMENT AND PLAN    Ms. Heiner is a 73 y.o. with:  1. OAB (overactive bladder)   2. Recurrent UTI    Patient is not currently taking medication. We discussed trying a lower dose of Trospium  she could choose to take 1 or 2 times per day. Trospium   20mg  x2 daily prescribed.  Will continue on low dose antibiotics for 3 more months and then plan to switch to Hiprex. We discussed continuing on her cranberry tablets as well as continuing vaginal estrogen and then at the next 3 month check I will do a pelvic exam and we will switch to the anti-infective. Patient is agreeable to plan of care.   Patient to follow up in 3 months for medication check and vaginal check   Crystalann Korf G Shirley Decamp, NP

## 2023-12-02 DIAGNOSIS — L738 Other specified follicular disorders: Secondary | ICD-10-CM | POA: Diagnosis not present

## 2023-12-02 DIAGNOSIS — Z1283 Encounter for screening for malignant neoplasm of skin: Secondary | ICD-10-CM | POA: Diagnosis not present

## 2023-12-02 DIAGNOSIS — L815 Leukoderma, not elsewhere classified: Secondary | ICD-10-CM | POA: Diagnosis not present

## 2023-12-02 DIAGNOSIS — L821 Other seborrheic keratosis: Secondary | ICD-10-CM | POA: Diagnosis not present

## 2023-12-02 DIAGNOSIS — L720 Epidermal cyst: Secondary | ICD-10-CM | POA: Diagnosis not present

## 2023-12-02 DIAGNOSIS — D2372 Other benign neoplasm of skin of left lower limb, including hip: Secondary | ICD-10-CM | POA: Diagnosis not present

## 2023-12-02 DIAGNOSIS — L814 Other melanin hyperpigmentation: Secondary | ICD-10-CM | POA: Diagnosis not present

## 2023-12-06 ENCOUNTER — Other Ambulatory Visit: Payer: Self-pay | Admitting: Family Medicine

## 2024-01-06 ENCOUNTER — Other Ambulatory Visit: Payer: Self-pay | Admitting: Obstetrics and Gynecology

## 2024-01-06 ENCOUNTER — Telehealth: Payer: Self-pay | Admitting: Obstetrics and Gynecology

## 2024-01-06 DIAGNOSIS — R350 Frequency of micturition: Secondary | ICD-10-CM

## 2024-01-06 NOTE — Progress Notes (Signed)
 Patient having UTI symptoms and cannot come to office. Will fax order to [facility] in [place].

## 2024-01-06 NOTE — Telephone Encounter (Signed)
 Pt c/o uti sx.  Schedule not allowing her to come at an available time we have.  She wants to know if she can go to a [facility] there and they can send results?

## 2024-01-11 LAB — URINALYSIS
Bilirubin, UA: NEGATIVE
Glucose, UA: NEGATIVE
Nitrite, UA: POSITIVE — AB
Specific Gravity, UA: 1.02 (ref 1.005–1.030)
Urobilinogen, Ur: 0.2 mg/dL (ref 0.2–1.0)
pH, UA: 6.5 (ref 5.0–7.5)

## 2024-01-11 LAB — URINE CULTURE

## 2024-01-12 ENCOUNTER — Ambulatory Visit: Payer: Self-pay | Admitting: Obstetrics and Gynecology

## 2024-01-12 DIAGNOSIS — N3000 Acute cystitis without hematuria: Secondary | ICD-10-CM

## 2024-01-12 MED ORDER — CIPROFLOXACIN HCL 500 MG PO TABS: 500.0000 mg | ORAL_TABLET | Freq: Two times a day (BID) | ORAL | 0 refills | Status: AC

## 2024-01-14 ENCOUNTER — Ambulatory Visit (INDEPENDENT_AMBULATORY_CARE_PROVIDER_SITE_OTHER): Admitting: Family Medicine

## 2024-01-14 ENCOUNTER — Encounter: Payer: Self-pay | Admitting: Obstetrics and Gynecology

## 2024-01-14 ENCOUNTER — Ambulatory Visit (INDEPENDENT_AMBULATORY_CARE_PROVIDER_SITE_OTHER)
Admission: RE | Admit: 2024-01-14 | Discharge: 2024-01-14 | Disposition: A | Discharge status: Home / Self Care | Source: Ambulatory Visit | Attending: Family Medicine | Admitting: Family Medicine

## 2024-01-14 ENCOUNTER — Encounter: Payer: Self-pay | Admitting: Family Medicine

## 2024-01-14 VITALS — BP 140/80 | HR 67 | Temp 98.5°F | Ht 60.5 in | Wt 175.6 lb

## 2024-01-14 DIAGNOSIS — M25521 Pain in right elbow: Secondary | ICD-10-CM

## 2024-01-14 DIAGNOSIS — R300 Dysuria: Secondary | ICD-10-CM

## 2024-01-14 DIAGNOSIS — E559 Vitamin D deficiency, unspecified: Secondary | ICD-10-CM | POA: Diagnosis not present

## 2024-01-14 DIAGNOSIS — I10 Essential (primary) hypertension: Secondary | ICD-10-CM

## 2024-01-14 DIAGNOSIS — Z7185 Encounter for immunization safety counseling: Secondary | ICD-10-CM

## 2024-01-14 DIAGNOSIS — M8580 Other specified disorders of bone density and structure, unspecified site: Secondary | ICD-10-CM | POA: Diagnosis not present

## 2024-01-14 DIAGNOSIS — M19021 Primary osteoarthritis, right elbow: Secondary | ICD-10-CM | POA: Diagnosis not present

## 2024-01-14 LAB — CBC WITH DIFFERENTIAL/PLATELET
Basophils Absolute: 0 K/uL (ref 0.0–0.1)
Basophils Relative: 0.9 % (ref 0.0–3.0)
Eosinophils Absolute: 0.1 K/uL (ref 0.0–0.7)
Eosinophils Relative: 3.6 % (ref 0.0–5.0)
HCT: 36.8 % (ref 36.0–46.0)
Hemoglobin: 12.1 g/dL (ref 12.0–15.0)
Lymphocytes Relative: 30.5 % (ref 12.0–46.0)
Lymphs Abs: 1.1 K/uL (ref 0.7–4.0)
MCHC: 32.9 g/dL (ref 30.0–36.0)
MCV: 92.3 fl (ref 78.0–100.0)
Monocytes Absolute: 0.7 K/uL (ref 0.1–1.0)
Monocytes Relative: 18.9 % — ABNORMAL HIGH (ref 3.0–12.0)
Neutro Abs: 1.6 K/uL (ref 1.4–7.7)
Neutrophils Relative %: 46.1 % (ref 43.0–77.0)
Platelets: 197 K/uL (ref 150.0–400.0)
RBC: 3.99 Mil/uL (ref 3.87–5.11)
RDW: 13.2 % (ref 11.5–15.5)
WBC: 3.6 K/uL — ABNORMAL LOW (ref 4.0–10.5)

## 2024-01-14 LAB — COMPREHENSIVE METABOLIC PANEL WITH GFR
ALT: 15 U/L (ref 0–35)
AST: 18 U/L (ref 0–37)
Albumin: 3.8 g/dL (ref 3.5–5.2)
Alkaline Phosphatase: 65 U/L (ref 39–117)
BUN: 27 mg/dL — ABNORMAL HIGH (ref 6–23)
CO2: 30 meq/L (ref 19–32)
Calcium: 9.3 mg/dL (ref 8.4–10.5)
Chloride: 102 meq/L (ref 96–112)
Creatinine, Ser: 0.84 mg/dL (ref 0.40–1.20)
GFR: 68.91 mL/min (ref >60.00)
Glucose, Bld: 88 mg/dL (ref 70–99)
Potassium: 4.2 meq/L (ref 3.5–5.1)
Sodium: 139 meq/L (ref 135–145)
Total Bilirubin: 0.3 mg/dL (ref 0.2–1.2)
Total Protein: 6.7 g/dL (ref 6.0–8.3)

## 2024-01-14 LAB — LIPID PANEL
Cholesterol: 194 mg/dL (ref 0–200)
HDL: 57.5 mg/dL (ref >39.00)
LDL Cholesterol: 121 mg/dL — ABNORMAL HIGH (ref 0–99)
NonHDL: 136.43
Total CHOL/HDL Ratio: 3
Triglycerides: 75 mg/dL (ref 0.0–149.0)
VLDL: 15 mg/dL (ref 0.0–40.0)

## 2024-01-14 LAB — VITAMIN D 25 HYDROXY (VIT D DEFICIENCY, FRACTURES): VITD: 34.44 ng/mL (ref 30.00–100.00)

## 2024-01-14 LAB — TSH: TSH: 2.75 u[IU]/mL (ref 0.35–5.50)

## 2024-01-14 MED ORDER — AMLODIPINE BESYLATE 2.5 MG PO TABS: 2.5000 mg | ORAL_TABLET | Freq: Every day | ORAL | 3 refills | Status: DC

## 2024-01-14 MED ORDER — AMLODIPINE BESYLATE 2.5 MG PO TABS: 2.5000 mg | ORAL_TABLET | Freq: Every day | ORAL | 3 refills | Status: AC

## 2024-01-14 NOTE — Patient Instructions (Addendum)
 Go to the lab on the way out.   If you have mychart we'll likely use that to update you.    If your vitamin D  is low, then we'll work on that.  Reasonable to repeat the bone density test after 09/2024.   If your BP is persistently above 140/90, then you could try increasing amlodipine  to 5mg .    Consider cardiac CT after checking labs.    Take care.  Glad to see you.

## 2024-01-14 NOTE — Progress Notes (Signed)
 Osteopenia on DXA 09/2022, T score -2.2.  Taking vitamin D .  Discussed previous results and that she was not due for follow-up bone density now.  Still on septra  per urogynecology at baseline with recent rx for cipro .   Hypertension:    Using medication without problems or lightheadedness: yes Chest pain with exertion:no Edema:no Short of breath:no Average home BPs: variable BP at MD visits.  Lowest at home/MD visit usually SVP 140-150.  She had her shingles shots, d/w pt.   Flu shot encouraged.   No trauma but limited R elbow flexion.  No trauma.  See exam.  Meds, vitals, and allergies reviewed.   ROS: Per HPI unless specifically indicated in ROS section   GEN: nad, alert and oriented HEENT: ncat NECK: supple w/o LA CV: rrr.  PULM: ctab, no inc wob ABD: soft, +bs EXT: no edema SKIN: no acute rash Normal R grip and supination/pronation.  Near but not full ext, flexion limted to 90 deg from pain.  No redness.  Med/lat epicondyle not ttp.  No elbow joint erythema.

## 2024-01-16 ENCOUNTER — Ambulatory Visit: Payer: Self-pay | Admitting: Family Medicine

## 2024-01-16 DIAGNOSIS — Z7185 Encounter for immunization safety counseling: Secondary | ICD-10-CM | POA: Insufficient documentation

## 2024-01-16 DIAGNOSIS — M25521 Pain in right elbow: Secondary | ICD-10-CM | POA: Insufficient documentation

## 2024-01-16 DIAGNOSIS — R300 Dysuria: Secondary | ICD-10-CM | POA: Insufficient documentation

## 2024-01-16 NOTE — Assessment & Plan Note (Addendum)
 Osteopenia on DXA 09/2022, T score -2.2.  Taking vitamin D .  Discussed previous results and that she was not due for follow-up bone density now. See notes on labs.

## 2024-01-16 NOTE — Assessment & Plan Note (Signed)
 She had her shingles shots, d/w pt.   Flu shot encouraged.

## 2024-01-16 NOTE — Assessment & Plan Note (Signed)
 If BP is persistently above 140/90, then she could try increasing amlodipine  to 5mg .   Consider cardiac CT after checking labs.  D/w pt about possible cardiac CT.

## 2024-01-16 NOTE — Assessment & Plan Note (Signed)
 Of unclear source.  See notes on imaging.

## 2024-01-16 NOTE — Assessment & Plan Note (Signed)
 Still on septra  per urogynecology at baseline with recent rx for cipro . I'll defer.  She agrees.

## 2024-01-31 DIAGNOSIS — Z1231 Encounter for screening mammogram for malignant neoplasm of breast: Secondary | ICD-10-CM | POA: Diagnosis not present

## 2024-02-21 ENCOUNTER — Encounter: Payer: Self-pay | Admitting: Obstetrics and Gynecology

## 2024-02-21 ENCOUNTER — Ambulatory Visit: Admitting: Obstetrics and Gynecology

## 2024-02-21 VITALS — BP 168/86 | HR 63

## 2024-02-21 DIAGNOSIS — R350 Frequency of micturition: Secondary | ICD-10-CM

## 2024-02-21 DIAGNOSIS — Z87440 Personal history of urinary (tract) infections: Secondary | ICD-10-CM

## 2024-02-21 DIAGNOSIS — N390 Urinary tract infection, site not specified: Secondary | ICD-10-CM

## 2024-02-21 DIAGNOSIS — N3281 Overactive bladder: Secondary | ICD-10-CM | POA: Diagnosis not present

## 2024-02-21 MED ORDER — METHENAMINE HIPPURATE 1 G PO TABS: 1.0000 g | ORAL_TABLET | Freq: Every day | ORAL | 2 refills | Status: AC

## 2024-02-21 MED ORDER — TROSPIUM CHLORIDE 20 MG PO TABS: 20.0000 mg | ORAL_TABLET | Freq: Two times a day (BID) | ORAL | 5 refills | Status: AC

## 2024-02-21 NOTE — Progress Notes (Signed)
 [facility] Return Visit  SUBJECTIVE  History of Present Illness: Denise Norris is a 73 y.o. female seen in follow-up for OAB and rUTI. Plan at last visit was finish low-dose bactrim . She has had one breakthrough UTI that was + for Klebsiella in September with no UTI since then. She is using vaginal estrogen cream and is taking trospium  which she feels is supporting her bladder somewhat.  [employer] sent a request for cheaper medication, but the alternatives are not suggested over the age of 64 due to risk for brain fog and memory impairment.     Past Medical History: Patient  has a past medical history of Arthritis, Connective tissue disorder, GERD (gastroesophageal reflux disease), Glaucoma, and Migraine.   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   Medications: She has a current medication list which includes the following prescription(s): acetaminophen , amlodipine , calcium carbonate-vit d-min, docusate sodium , estradiol , famotidine , glucosamine-chondroitin-msm, methenamine, multiple vitamin, multiple vitamins-minerals, timolol , vitamin d  (cholecalciferol ), vitamin k, and trospium .   Allergies: Patient is allergic to codeine.   Social History: Patient  reports that she has never smoked. She has never used smokeless tobacco. She reports that she does not drink alcohol and does not use drugs.     OBJECTIVE     Physical Exam: Vitals:   02/21/24 0858 02/21/24 0918  BP: (!) 167/90 (!) 168/86  Pulse: 69 63   Gen: No apparent distress, A&O x 3.  Detailed Urogynecologic Evaluation:  Deferred.    ASSESSMENT AND PLAN    Ms. Chumley is a 73 y.o. with:  1. OAB (overactive bladder)    2. Recurrent UTI   3. Urinary frequency    Patient to continue Trospium  at this time. She reports her nocturia has decreased.  Patient had completed low dose bactrim . Will change to Hiprex for UTI support. Patient also to continue on estrogen cream x2 weekly for tissue support.  No current UTI symptoms. Patient to call if she has symptoms. We can send an order to [facility] if needed due to her distance.   Patient to follow up in 6 months or sooner if needed.     Acacia Latorre G Laurencia Roma, NP

## 2024-02-21 NOTE — Patient Instructions (Signed)
 Start hiprex daily, take this medication with food so it does not upset your stomach.   Continue Trospium  20mg  x2 daily.   Continue estrogen cream twice a week.   Please call if you need anything or need us  to send an order to [facility] for a urine test.

## 2024-02-24 ENCOUNTER — Encounter: Payer: Self-pay | Admitting: *Deleted

## 2024-02-25 NOTE — Telephone Encounter (Signed)
 Please burn a copy of her elbow x-rays and let her know about picking that up.  Thanks.

## 2024-02-28 NOTE — Telephone Encounter (Signed)
 Disc of the elbow has been burned, and left at the front desk for the patient to pick up. Left VM and MyChart message letting the patient know.

## 2024-04-10 DIAGNOSIS — H401131 Primary open-angle glaucoma, bilateral, mild stage: Secondary | ICD-10-CM | POA: Diagnosis not present

## 2024-05-01 ENCOUNTER — Telehealth: Payer: Self-pay

## 2024-05-01 ENCOUNTER — Other Ambulatory Visit: Payer: Self-pay | Admitting: Obstetrics and Gynecology

## 2024-05-01 DIAGNOSIS — R300 Dysuria: Secondary | ICD-10-CM

## 2024-05-01 NOTE — Telephone Encounter (Signed)
 Patient called reporting she has UTI sx and she would like to go to [facility] in [place]: [facility] [address], [place] [address] Fax: [phone]

## 2024-05-02 LAB — URINALYSIS
Bilirubin, UA: NEGATIVE
Glucose, UA: NEGATIVE
Ketones, UA: NEGATIVE
Nitrite, UA: POSITIVE — AB
Specific Gravity, UA: 1.019 (ref 1.005–1.030)
Urobilinogen, Ur: 0.2 mg/dL (ref 0.2–1.0)
pH, UA: 6.5 (ref 5.0–7.5)

## 2024-05-03 ENCOUNTER — Other Ambulatory Visit: Payer: Self-pay

## 2024-05-03 MED ORDER — CIPROFLOXACIN HCL 500 MG PO TABS: 500.0000 mg | ORAL_TABLET | Freq: Two times a day (BID) | ORAL | 0 refills | Status: AC

## 2024-05-04 LAB — URINE CULTURE

## 2024-05-05 ENCOUNTER — Ambulatory Visit: Payer: Self-pay | Admitting: Obstetrics and Gynecology

## 2024-07-27 ENCOUNTER — Ambulatory Visit
